# Patient Record
Sex: Male | Born: 1955
Health system: Southern US, Community
[De-identification: ages and names within clinical notes are randomized; demographics above are authoritative.]

## PROBLEM LIST (undated history)

## (undated) DIAGNOSIS — I509 Heart failure, unspecified: Secondary | ICD-10-CM

## (undated) DIAGNOSIS — I4891 Unspecified atrial fibrillation: Secondary | ICD-10-CM

## (undated) DIAGNOSIS — I130 Hypertensive heart and chronic kidney disease with heart failure and stage 1 through stage 4 chronic kidney disease, or unspecified chronic kidney disease: Secondary | ICD-10-CM

## (undated) DIAGNOSIS — I480 Paroxysmal atrial fibrillation: Secondary | ICD-10-CM

## (undated) DIAGNOSIS — I10 Essential (primary) hypertension: Secondary | ICD-10-CM

## (undated) DIAGNOSIS — I341 Nonrheumatic mitral (valve) prolapse: Secondary | ICD-10-CM

## (undated) DIAGNOSIS — I340 Nonrheumatic mitral (valve) insufficiency: Secondary | ICD-10-CM

## (undated) DIAGNOSIS — N529 Male erectile dysfunction, unspecified: Secondary | ICD-10-CM

## (undated) DIAGNOSIS — M1A9XX0 Chronic gout, unspecified, without tophus (tophi): Secondary | ICD-10-CM

## (undated) DIAGNOSIS — Z7901 Long term (current) use of anticoagulants: Secondary | ICD-10-CM

## (undated) DIAGNOSIS — I421 Obstructive hypertrophic cardiomyopathy: Secondary | ICD-10-CM

## (undated) HISTORY — DX: Heart failure, unspecified: I50.9

## (undated) HISTORY — DX: Obstructive hypertrophic cardiomyopathy: I42.1

## (undated) HISTORY — PX: CHOLECYSTECTOMY: SHX55

## (undated) HISTORY — DX: Nonrheumatic mitral (valve) insufficiency: I34.0

## (undated) HISTORY — DX: Nonrheumatic mitral (valve) prolapse: I34.1

## (undated) HISTORY — DX: Paroxysmal atrial fibrillation: I48.0

## (undated) HISTORY — DX: Long term (current) use of anticoagulants: Z79.01

## (undated) HISTORY — PX: TONSILLECTOMY: SUR1361

## (undated) HISTORY — PX: INGUINAL HERNIA REPAIR: SUR1180

## (undated) HISTORY — DX: Male erectile dysfunction, unspecified: N52.9

## (undated) HISTORY — DX: Hypertensive heart and chronic kidney disease with heart failure and stage 1 through stage 4 chronic kidney disease, or unspecified chronic kidney disease: I13.0

## (undated) HISTORY — DX: Essential (primary) hypertension: I10

## (undated) HISTORY — DX: Unspecified atrial fibrillation: I48.91

## (undated) HISTORY — DX: Chronic gout, unspecified, without tophus (tophi): M1A.9XX0

---

## 2002-09-01 HISTORY — PX: CARDIOVASCULAR STRESS TEST: SHX262

## 2002-09-08 ENCOUNTER — Encounter: Admission: RE | Admit: 2002-09-08 | Discharge: 2002-09-08 | Payer: Self-pay | Admitting: Cardiothoracic Surgery

## 2002-09-08 ENCOUNTER — Encounter: Payer: Self-pay | Admitting: Cardiothoracic Surgery

## 2002-10-07 ENCOUNTER — Ambulatory Visit (HOSPITAL_COMMUNITY): Admission: RE | Admit: 2002-10-07 | Discharge: 2002-10-07 | Payer: Self-pay | Admitting: Cardiovascular Disease

## 2007-02-23 ENCOUNTER — Emergency Department (HOSPITAL_COMMUNITY): Admission: EM | Admit: 2007-02-23 | Discharge: 2007-02-23 | Payer: Self-pay | Admitting: Emergency Medicine

## 2007-02-25 ENCOUNTER — Ambulatory Visit: Payer: Self-pay | Admitting: Internal Medicine

## 2007-02-25 ENCOUNTER — Inpatient Hospital Stay (HOSPITAL_COMMUNITY): Admission: EM | Admit: 2007-02-25 | Discharge: 2007-02-26 | Payer: Self-pay | Admitting: Emergency Medicine

## 2008-09-06 ENCOUNTER — Emergency Department (HOSPITAL_COMMUNITY): Admission: EM | Admit: 2008-09-06 | Discharge: 2008-09-06 | Payer: Self-pay | Admitting: Emergency Medicine

## 2008-09-09 ENCOUNTER — Other Ambulatory Visit: Payer: Self-pay | Admitting: General Surgery

## 2008-09-09 ENCOUNTER — Inpatient Hospital Stay (HOSPITAL_COMMUNITY): Admission: EM | Admit: 2008-09-09 | Discharge: 2008-09-13 | Payer: Self-pay | Admitting: Emergency Medicine

## 2008-09-10 ENCOUNTER — Encounter: Payer: Self-pay | Admitting: Cardiovascular Disease

## 2008-09-10 HISTORY — PX: TRANSTHORACIC ECHOCARDIOGRAM: SHX275

## 2008-09-13 ENCOUNTER — Encounter: Payer: Self-pay | Admitting: Cardiovascular Disease

## 2008-09-13 HISTORY — PX: TRANSESOPHAGEAL ECHOCARDIOGRAM: SHX273

## 2008-09-21 ENCOUNTER — Encounter (INDEPENDENT_AMBULATORY_CARE_PROVIDER_SITE_OTHER): Payer: Self-pay | Admitting: General Surgery

## 2008-09-21 ENCOUNTER — Ambulatory Visit (HOSPITAL_COMMUNITY): Admission: RE | Admit: 2008-09-21 | Discharge: 2008-09-22 | Payer: Self-pay | Admitting: General Surgery

## 2010-07-03 ENCOUNTER — Ambulatory Visit: Payer: Self-pay | Admitting: Cardiovascular Disease

## 2010-11-21 LAB — COMPREHENSIVE METABOLIC PANEL
ALT: 100 U/L — ABNORMAL HIGH (ref 0–53)
ALT: 114 U/L — ABNORMAL HIGH (ref 0–53)
ALT: 35 U/L (ref 0–53)
ALT: 74 U/L — ABNORMAL HIGH (ref 0–53)
AST: 22 U/L (ref 0–37)
AST: 48 U/L — ABNORMAL HIGH (ref 0–37)
AST: 61 U/L — ABNORMAL HIGH (ref 0–37)
Albumin: 3.3 g/dL — ABNORMAL LOW (ref 3.5–5.2)
Albumin: 3.5 g/dL (ref 3.5–5.2)
Albumin: 3.6 g/dL (ref 3.5–5.2)
Alkaline Phosphatase: 107 U/L (ref 39–117)
Alkaline Phosphatase: 113 U/L (ref 39–117)
Alkaline Phosphatase: 58 U/L (ref 39–117)
Alkaline Phosphatase: 85 U/L (ref 39–117)
BUN: 15 mg/dL (ref 6–23)
BUN: 16 mg/dL (ref 6–23)
BUN: 18 mg/dL (ref 6–23)
CO2: 24 mEq/L (ref 19–32)
CO2: 26 mEq/L (ref 19–32)
CO2: 27 mEq/L (ref 19–32)
Calcium: 9 mg/dL (ref 8.4–10.5)
Calcium: 9.1 mg/dL (ref 8.4–10.5)
Chloride: 102 mEq/L (ref 96–112)
Chloride: 103 mEq/L (ref 96–112)
Chloride: 104 mEq/L (ref 96–112)
Creatinine, Ser: 1.37 mg/dL (ref 0.4–1.5)
Creatinine, Ser: 1.51 mg/dL — ABNORMAL HIGH (ref 0.4–1.5)
GFR calc Af Amer: 58 mL/min — ABNORMAL LOW (ref >60)
GFR calc Af Amer: >60 mL/min (ref >60)
GFR calc Af Amer: >60 mL/min (ref >60)
GFR calc non Af Amer: 48 mL/min — ABNORMAL LOW (ref >60)
GFR calc non Af Amer: 54 mL/min — ABNORMAL LOW (ref >60)
Glucose, Bld: 100 mg/dL — ABNORMAL HIGH (ref 70–99)
Glucose, Bld: 111 mg/dL — ABNORMAL HIGH (ref 70–99)
Glucose, Bld: 95 mg/dL (ref 70–99)
Potassium: 3.8 mEq/L (ref 3.5–5.1)
Potassium: 3.8 mEq/L (ref 3.5–5.1)
Potassium: 4 mEq/L (ref 3.5–5.1)
Potassium: 4.2 mEq/L (ref 3.5–5.1)
Sodium: 136 mEq/L (ref 135–145)
Sodium: 138 mEq/L (ref 135–145)
Sodium: 139 mEq/L (ref 135–145)
Sodium: 140 mEq/L (ref 135–145)
Total Bilirubin: 1.4 mg/dL — ABNORMAL HIGH (ref 0.3–1.2)
Total Bilirubin: 1.6 mg/dL — ABNORMAL HIGH (ref 0.3–1.2)
Total Bilirubin: 2.3 mg/dL — ABNORMAL HIGH (ref 0.3–1.2)
Total Protein: 6.4 g/dL (ref 6.0–8.3)
Total Protein: 7 g/dL (ref 6.0–8.3)
Total Protein: 7.4 g/dL (ref 6.0–8.3)

## 2010-11-21 LAB — DIFFERENTIAL
Basophils Absolute: 0 10*3/uL (ref 0.0–0.1)
Basophils Absolute: 0 10*3/uL (ref 0.0–0.1)
Basophils Absolute: 0.1 10*3/uL (ref 0.0–0.1)
Basophils Absolute: 0.1 10*3/uL (ref 0.0–0.1)
Basophils Relative: 0 % (ref 0–1)
Basophils Relative: 0 % (ref 0–1)
Basophils Relative: 1 % (ref 0–1)
Basophils Relative: 1 % (ref 0–1)
Eosinophils Absolute: 0 10*3/uL (ref 0.0–0.7)
Eosinophils Absolute: 0.1 10*3/uL (ref 0.0–0.7)
Eosinophils Absolute: 0.1 10*3/uL (ref 0.0–0.7)
Eosinophils Absolute: 0.2 10*3/uL (ref 0.0–0.7)
Eosinophils Relative: 0 % (ref 0–5)
Eosinophils Relative: 0 % (ref 0–5)
Eosinophils Relative: 1 % (ref 0–5)
Lymphocytes Relative: 10 % — ABNORMAL LOW (ref 12–46)
Lymphocytes Relative: 12 % (ref 12–46)
Lymphocytes Relative: 14 % (ref 12–46)
Lymphs Abs: 1.3 10*3/uL (ref 0.7–4.0)
Lymphs Abs: 1.3 10*3/uL (ref 0.7–4.0)
Lymphs Abs: 1.9 10*3/uL (ref 0.7–4.0)
Monocytes Absolute: 0.3 10*3/uL (ref 0.1–1.0)
Monocytes Absolute: 0.6 10*3/uL (ref 0.1–1.0)
Monocytes Absolute: 0.9 10*3/uL (ref 0.1–1.0)
Monocytes Relative: 3 % (ref 3–12)
Monocytes Relative: 5 % (ref 3–12)
Monocytes Relative: 6 % (ref 3–12)
Monocytes Relative: 7 % (ref 3–12)
Neutro Abs: 10.4 10*3/uL — ABNORMAL HIGH (ref 1.7–7.7)
Neutro Abs: 12.8 10*3/uL — ABNORMAL HIGH (ref 1.7–7.7)
Neutro Abs: 7.7 10*3/uL (ref 1.7–7.7)
Neutro Abs: 8 10*3/uL — ABNORMAL HIGH (ref 1.7–7.7)
Neutrophils Relative %: 74 % (ref 43–77)
Neutrophils Relative %: 81 % — ABNORMAL HIGH (ref 43–77)
Neutrophils Relative %: 82 % — ABNORMAL HIGH (ref 43–77)
Neutrophils Relative %: 84 % — ABNORMAL HIGH (ref 43–77)

## 2010-11-21 LAB — CARDIAC PANEL(CRET KIN+CKTOT+MB+TROPI)
CK, MB: 1.8 ng/mL (ref 0.3–4.0)
Relative Index: INVALID (ref 0.0–2.5)
Relative Index: INVALID (ref 0.0–2.5)
Total CK: 54 U/L (ref 7–232)
Troponin I: 0.15 ng/mL — ABNORMAL HIGH (ref 0.00–0.06)
Troponin I: 0.21 ng/mL — ABNORMAL HIGH (ref 0.00–0.06)

## 2010-11-21 LAB — CBC
HCT: 43.2 % (ref 39.0–52.0)
HCT: 46.5 % (ref 39.0–52.0)
HCT: 48.9 % (ref 39.0–52.0)
Hemoglobin: 14.7 g/dL (ref 13.0–17.0)
Hemoglobin: 16.3 g/dL (ref 13.0–17.0)
Hemoglobin: 16.8 g/dL (ref 13.0–17.0)
MCHC: 34.3 g/dL (ref 30.0–36.0)
MCHC: 35 g/dL (ref 30.0–36.0)
MCV: 88.5 fL (ref 78.0–100.0)
MCV: 89.9 fL (ref 78.0–100.0)
Platelets: 165 10*3/uL (ref 150–400)
Platelets: 222 10*3/uL (ref 150–400)
Platelets: 250 10*3/uL (ref 150–400)
RBC: 4.8 MIL/uL (ref 4.22–5.81)
RBC: 5.26 MIL/uL (ref 4.22–5.81)
RBC: 5.44 MIL/uL (ref 4.22–5.81)
RDW: 15.1 % (ref 11.5–15.5)
RDW: 15.1 % (ref 11.5–15.5)
RDW: 15.2 % (ref 11.5–15.5)
WBC: 10.4 10*3/uL (ref 4.0–10.5)
WBC: 12.3 10*3/uL — ABNORMAL HIGH (ref 4.0–10.5)
WBC: 15.7 10*3/uL — ABNORMAL HIGH (ref 4.0–10.5)

## 2010-11-21 LAB — POCT CARDIAC MARKERS
CKMB, poc: 1.7 ng/mL (ref 1.0–8.0)
Myoglobin, poc: 158 ng/mL (ref 12–200)
Myoglobin, poc: 257 ng/mL (ref 12–200)
Troponin i, poc: <0.05 ng/mL (ref 0.00–0.09)

## 2010-11-21 LAB — TSH: TSH: 2.137 u[IU]/mL (ref 0.350–4.500)

## 2010-11-21 LAB — URINALYSIS, ROUTINE W REFLEX MICROSCOPIC
Glucose, UA: NEGATIVE mg/dL
Hgb urine dipstick: NEGATIVE
Protein, ur: NEGATIVE mg/dL

## 2010-12-19 NOTE — Discharge Summary (Signed)
[name], VINES NO.:  [id number]   MEDICAL RECORD NO.:  [id number]          PATIENT TYPE:  INP   LOCATION:  4707                         FACILITY:  [facility]   PHYSICIAN:  Vesta Mixer, M.D. DATE OF BIRTH:  [birth year]   DATE OF ADMISSION:  09/09/2008  DATE OF DISCHARGE:  09/13/2008                               DISCHARGE SUMMARY   DISCHARGE DIAGNOSES:  1. Atrial fibrillation with rapid ventricular response.  2. Hypertrophic cardiomyopathy with known left ventricular outflow      tract gradient.  3. Mitral valve prolapse with systolic anterior motion of the mitral      valve leaflet.  4. Hypertension.  5. Mild obesity.   DISCHARGE MEDICATIONS:  1. Lovenox 120 mg subcutaneously twice a day.  2. Lisinopril 10 mg a day.  3. Metoprolol 50 mg twice a day.  4. Aspirin 81 mg a day.   DISPOSITION:  The patient has been instructed to take his Lovenox every  12 hours.  He will get in contact with Dr. Doreen Salvage office this week  for a possible surgery next week.  He needs to have a cholecystectomy.  He will continue the Lovenox for at least a week and possibly 2 weeks.  We will see him in followup in a week or two.   HISTORY:  [name] is a 55 year old gentleman who was admitted to the  hospital with new onset atrial fibrillation.  He had previously been  evaluated by Dr. Dwain Sarna for cholecystitis.  Please see dictated H and  P for further details.   HOSPITAL COURSE:  1. Atrial fibrillation.  The patient presented with atrial      fibrillation and was started on a Cardizem drip.  His rate slowed,      but he field to convert.  His rate was fairly well controlled.  On      September 13, 2008, he underwent a TEE.  The TEE revealed no thrombus      in the left atrial appendage or left atrium.  He had normal left      ventricular systolic function.  He subsequently underwent      cardioversion.  He had been continued on the Lovenox and that at      least need  to take for a week or two.  Overall, he has remained      stable and he tolerated the procedure quite well.  2. Cholecystitis.  The patient has cholecystitis with evidence of      gallstones.  He needs to have a cholecystectomy.  We anticipate      doing surgery next week.  If for some reason the surgery is      delayed, it will not be for another month or so.  We will stop the      Lovenox and start him on Coumadin.  He will anticipate several      weeks of      anticoagulation following this cardioversion.  It would not be      possible to start him on Coumadin and then  bring his protime down      in time to have a cholecystectomy next week.  Therefore, we have      opted to go with subcutaneous Lovenox for the next 7-10 days.      Also, other medical problems are stable.      Vesta Mixer, M.D.  Electronically Signed     PJN/MEDQ  D:  09/13/2008  T:  09/14/2008  Job:  [id number]   cc:   Juanetta Gosling, MD
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
## 2010-12-19 NOTE — Consult Note (Signed)
[name], [name]:  [id number]   MEDICAL RECORD NO.:  [id number]          PATIENT TYPE:  AMB   LOCATION:  SDS                          FACILITY:  [facility]   PHYSICIAN:  [name], MDDATE OF BIRTH:  Mar 11, 1956   DATE OF CONSULTATION:  DATE OF DISCHARGE:                                 CONSULTATION   [name] is a 55 year old male who I saw him in the office yesterday  for cholelithiasis.  He had an episode on Sunday and Monday of some  abdominal pain, epigastric or right upper quadrant associated with  nausea and vomiting, seen in emergency room at that point  with  diagnosis of cholelithiasis with no evidence of cholecystitis at that  point.  Since I saw him on Wednesday, he has continued to have some  abdominal pain and sweats.  He has not been hungry at first during that  time and was burping a lot.  Denies any fevers.  He has not just feeling  well and he was feeling fairly lethargic since then.  I examined him in  the office and do not think he had cholecystitis, but scheduled him  today for a laparoscopic cholecystectomy due to his continued symptoms.  On evaluation today, preoperatively he felt little worse than he did  yesterday and he was noted to have rapid atrial fibrillation with heart  rate in the 130s.  I then had him cancel his operation and had him  report to the emergency room where he has been evaluated by [facility]  Cardiology Associate, [name].  He is a patient of [name] and he  is awaiting admission right now.  On his abdominal exam, he remains only  mildly tender in his right upper quadrant with deep palpation.  Has no  Murphy sign.  His white blood cell count is 12.3 today and the bilirubin  is mildly elevated at 1.6 this afternoon as well.  I do not think again  he has acute cholecystitis.  We will continue to follow along with Dr.  Elease Adams, the cardiologist and we can both determine whether it would be  safe to  perform a cholecystectomy on him, but this will likely be  pending further cardiac evaluation unless he develops an increasing  bilirubin or other signs that he will need an operation sooner than  that.  Thank you very much for assisting with his care.      Johnny Gosling, MD  Electronically Signed     MCW/MEDQ  D:  09/09/2008  T:  09/10/2008  Job:  [id number]   cc:   Elmore Guise., M.D.  Vesta Mixer, M.D.

## 2010-12-19 NOTE — H&P (Signed)
[name], [name] NO.:  [id number]   MEDICAL RECORD NO.:  [id number]          PATIENT TYPE:  INP   LOCATION:  1832                         FACILITY:  [facility]   PHYSICIAN:  Elmore Guise., M.D.DATE OF BIRTH:  1956/06/03   DATE OF ADMISSION:  09/09/2008  DATE OF DISCHARGE:                              HISTORY & PHYSICAL   INDICATION FOR ADMISSION:  New onset atrial fibrillation.   PRIMARY CARDIOLOGIST:  Vesta Mixer, M.D.   HISTORY OF PRESENT ILLNESS:  [name] is a 55 year old white male past  medical history of obesity, hypertrophic cardiomyopathy, hypertension  who presents for evaluation and treatment of new-onset atrial  fibrillation.  Patient reports over the last week a downhill course.  Sunday night he had tremendous abdominal pain and cramping, this was  also associated with nausea and increasing gas.  He was brought to the  emergency room and a CT of the abdomen and pelvis were performed which  showed gallstones, he then had an ultrasound which again showed  gallstones with borderline gallbladder thickening.  He had no evidence  of acute cholecystitis and was discharged home.  He went and followed up  with a general surgeon and was scheduled for elective cholecystectomy  today.  However, on a preop electrocardiogram he was noted to be in  rapid atrial fibrillation with heart rate in the 130s.  The patient  notes since Sunday he has been having increasing fatigue, just feeling  tired and listless.  He denies any chest pain or shortness of breath.  His abdominal pain has resolved.  He has also noted decreased p.o.  intake since Sunday because I just have no appetite.  He was sent to  the emergency room for further evaluation and treatment.  Here, he was  started on Cardizem drip at 5 mg per hour and his heart rate is in the  90-120 range.  He is awaiting admission.   REVIEW OF SYSTEMS:  Are as per HPI.  All others are negative.   CURRENT  MEDICATIONS:  1. Lisinopril 10 mg daily.  2. Metoprolol 50 mg twice daily.  3. Aspirin 81 mg daily.   ALLERGIES:  CODEINE.   FAMILY HISTORY:  Positive for COPD with his mother, father was an  alcoholic.   SOCIAL HISTORY:  He is married.  No tobacco or alcohol use.   EXAM:  He is afebrile.  Blood pressure is 120/70, heart rates ranging  from 95-120, showing atrial fibrillation, saturating 95% on room air.  GENERAL:  He is a very pleasant middle-aged white male, alert and  oriented x4, in no acute distress.  NECK:  Supple.  No lymphadenopathy, 2+ carotids, no JVD and no bruits.  LUNGS:  Clear.  HEART:  Irregular regular with 2/6 systolic ejection murmur.  ABDOMEN:  Obese, soft, nontender, nondistended.  EXTREMITIES:  Warm with 2+ pulses and no significant edema.  NEUROLOGICALLY:  There are no focal deficits noted.  SKIN:  Warm and dry without evidence of rashes or ecchymosis.   His blood work was reviewed and showed potassium of  3.8, BUN and  creatinine of 18 and 1.3, his total bilirubin was 1.6, alkaline  phosphatase of 107, AST of 48 and ALT of 100.  His magnesium was 2.4.  Cardiac markers were negative with troponin less than 0.05 and MB of  2.6.  CBC showed a white count of 12.3, hemoglobin of 16.3 and platelet  count of 222.  He had 84% neutrophils, 10% lymphs, no bands.  Total  bilirubin actually was down from a CMP done earlier, down from 2.3 now  to 1.6, lipase earlier in the week was negative, renal function appear  prerenal at current time.  His echocardiogram shows rapid atrial  fibrillation, rate of 138 beats per minute with LVH and strain pattern  unchanged from his prior electrocardiograms in the office.  Electrocardiogram #2 shows atrial fibrillation with rapid ventricular  response, heart rate of 122 per minute with LVH and strain pattern.  His  last electrocardiogram in the office was done December 2009, showed  normal sinus rhythm.  He had his last echocardiogram  done June 2006,  showed an EF of 75% with moderate to severe LVH, septum measured 21-mm,  posterior wall 19-mm, he had mild aortic insufficiency, trace mitral and  tricuspid valve regurgitation.  He has also had a cardiac  catheterization in the past which showed normal coronary arteries, this  was done back in 2004.   IMPRESSION:  1. New-onset atrial fibrillation.  2. History of hypertrophic cardiomyopathy.  3. Obesity.  4. Cholelithiasis.  5. Mild dehydration.   PLAN:  The patient will be admitted.  We will increase his Cardizem  drip, as needed, for rate control.  Hopefully, he will convert to normal  sinus rhythm after rate is controlled.  We will hold his lisinopril for  now.  I do think his mildly increased BUN and creatinine for him is  secondary to dehydration.  He will have a repeat TSH done as well as an  echocardiogram done, since his last study was over 4 years ago.  We will  continue his metoprolol for now.  I do think holding on his surgery  would be best at this time.  This was discussed with the patient and his  wife at length.      Elmore Guise., M.D.  Electronically Signed     TWK/MEDQ  D:  09/09/2008  T:  09/09/2008  Job:  [id number]

## 2010-12-19 NOTE — Op Note (Signed)
[name], PLOCH NO.:  [id number]   MEDICAL RECORD NO.:  [id number]          PATIENT TYPE:  OIB   LOCATION:  1614                         FACILITY:  [facility]   PHYSICIAN:  Juanetta Gosling, MDDATE OF BIRTH:  1956/01/14   DATE OF PROCEDURE:  09/21/2008  DATE OF DISCHARGE:                               OPERATIVE REPORT   PREOPERATIVE DIAGNOSIS:  Chronic cholecystitis.   POSTOPERATIVE DIAGNOSIS:  Chronic cholecystitis.   PROCEDURE:  Laparoscopic cholecystectomy with cholangiogram.   SURGEON:  Harden Mo, M.D.   ASSISTANT:  Darnell Level, M.D.   ANESTHESIA:  General.   FINDINGS:  Acute on chronic cholecystitis.   SPECIMENS:  Gallbladder and contents to pathology.   FINDINGS:  Cholangiogram was normal.   ESTIMATED BLOOD LOSS:  50 mL.   COMPLICATIONS:  None.   DRAINS:  None.   DISPOSITION:  To recovery room in stable condition.   INDICATIONS:  Mr. Schleicher is a 55 year old male who had a couple of  episodes in the last month of right upper quadrant pain, nausea and  vomiting.  He was seen in the emergency room, was found to have a normal  white blood cell count, and was afebrile.  Underwent a CT scan that  showed gallstones and an ultrasound later that showed multiple  subcentimeter stones and sludge with borderline gallbladder wall  thickening and negative sonographic Murphy's sign.  He was sent home on  pain medication.  He saw me on February 3 still feeling very poorly, and  I scheduled him to remove his gallbladder the following day.  The  following day, he remained for his EKG preoperatively and was found to  be in atrial fibrillation with rapid ventricular response.  He was  admitted by Dr. Elease Hashimoto and was recontrolled and eventually cardioverted  and placed on anticoagulation.  In discussion with Dr. Elease Hashimoto, I elected  to take his gallbladder out this week, and he came in today still having  some right upper quadrant pain occasionally for his  cholecystectomy.   PROCEDURE:  After informed consent was obtained, the patient was taken  to the operating room.  He was administered 1 gram of intravenous  cefoxitin.  He then underwent general endotracheal anesthesia without  complication.  His abdomen was then prepped and draped in standard  sterile surgical fashion with chlorhexidine.  Surgical time out was then  performed.   A 10-mm infraumbilical curvilinear incision was then made below the  level of his umbilical hernia that was present preoperatively.  Dissection was carried out down to the level of fascia.  This was then  entered sharply.  A 0 Vicryl pursestring suture was then placed through  the fascia.  The peritoneum was then entered bluntly.  I then introduced  a Hassan trocar and insufflated the abdomen to 15 mmHg pressure.  He was  then placed in the head-up/left-side down position.  Three further 5-mm  trocars were placed under direct vision after infiltration with local  anesthetic without complication.  He was noted to have some omental  adhesions to his gallbladder that were  brought down bluntly.  His  gallbladder was then retracted cephalad and lateral.  He had a  significant amount of inflammation surrounding his gallbladder and was  very difficult to dissect his triangle of Calot.  Dissection was  eventually able to identify a very short cystic duct at the common bile  duct junction as well as the cystic artery.  The cystic artery was  encircled and then clipped and divided.  The cystic duct was then  identified.  A clip was placed distally.  A Cook catheter was then  introduced into the abdomen.  A ductotomy was then made, and the [facility]  catheter was introduced into the cystic duct.  Another clip was used to  secure this in position.  This flowed freely without any leakage.  Cholangiogram was then performed.  I put him in the head down position  to get this to fill proximally, but in the end, both hepatic  radicals  filled,Cook catheter was positioned in the cystic duct and the duodenum  filled without any evidence of any stones.  The clip and catheter were  then removed.  The cystic duct was then clipped twice and divided.  The  gallbladder was then removed from the liver bed with some difficulty.  I  made several entrances into the liver, and bleeding was controlled with  cautery.  There was a very thick rind around his gallbladder.  There  were no spillage of bile or stones during this portion.  Eventually, the  gallbladder was removed from the liver bed.  This was placed in an  EndoCatch bag and removed from the umbilicus.  Due to the amount of  stones, the incision had to be enlarged significantly to remove this.  The bag did rip at this portion, and the gallbladder did come in contact  with the wound for a very short period of time.  Gallbladder eventually  was removed and passed off the table as a specimen.  I then used 3  figure-of-eight 0 Vicryl sutures to repair his umbilical hernia as well  as the defect from my incision.  Upon inspection, there was no further  defect at the end.  Hemostasis was then observed.  Copious irrigation  was performed.  The abdomen was then desufflated, and the trocars were  all removed.  The incisions were all closed with a 4-0 Monocryl in a  subcuticular fashion.  Steri-Strips and sterile dressing was placed all  over these.  He tolerated this well, was extubated in the operating room  and transferred to the recovery room in stable condition.      Juanetta Gosling, MD  Electronically Signed     MCW/MEDQ  D:  09/21/2008  T:  09/21/2008  Job:  [id number]   cc:   Vesta Mixer, M.D.  Fax: [phone]

## 2010-12-22 NOTE — H&P (Signed)
NAME:  [name], [name]                       ACCOUNT NO.:  [id number]   MEDICAL RECORD NO.:  [id number]                   PATIENT TYPE:  OIB   LOCATION:                                       FACILITY:  [facility]   PHYSICIAN:  Vesta Mixer, M.D.              DATE OF BIRTH:  1956/05/24   DATE OF ADMISSION:  10/07/2002  DATE OF DISCHARGE:                                HISTORY & PHYSICAL   HISTORY OF PRESENT ILLNESS:  [name] is a 55 year old gentleman with a  history of chest pain and congestive heart failure.  He has had a long  history of hypertension.  He has been recently seen in our office and has a  dilated left ventricle with somewhat of a reduced left ventricular systolic  function.  He was also found to have a lung mass on chest x-ray, but  subsequent evaluations of this mass have revealed that it appears to be  benign.  He has been followed by Dr. Charlett Lango for this lung mass.  [name] has not been to the doctor on a regular basis.  He states that he  really only goes when he has any specific problems.  He thinks that he has  had some hypertension for the past 5 or 6 years.  He is admitted to the  hospital for heart catheterization for further evaluation of his  cardiomyopathy.   CURRENT MEDICATIONS:  1. Hydrochlorothiazide 25 mg daily.  2. Altace 10 mg p.o. b.i.d.  3. Coreg 3.125 mg twice a day.   ALLERGIES:  CODEINE.   PAST MEDICAL HISTORY:  Hypertension.   SOCIAL HISTORY:  The patient works as a Counsellor at [employer].  He does not smoke and does not drink alcohol.   FAMILY HISTORY:  His father died of alcoholism.  His mother died due to  complications related to COPD.   REVIEW OF SYSTEMS:  He has had lots of fatigue and shortness of breath  recently.  He states that he is feeling a little bit better since we started  him on some medications.   PHYSICAL EXAMINATION:  GENERAL:  He is a middle-aged gentleman in no acute  distress.   He is alert and oriented x3, and his mood and affect are normal.  VITAL SIGNS:  Weight is 249, blood pressure 122/78, heart rate 84.  HEENT:  Reveals 2+ carotids.  He has no bruits.  There is no JVD, no  thyromegaly.  LUNGS:  Clear to auscultation.  HEART:  Regular rate.  S1 and S2 with no murmurs, gallops or rubs.  ABDOMEN:  Good bowel sounds, but is nontender.  EXTREMITIES:  He has no clubbing, cyanosis, or edema.  His pulses are  intact.   [name] presents with new onset congestive heart failure.  The most likely  cause of this is long-standing hypertension.  He really has not been to  see  the doctor in quite a long time.  I do think that we need to perform a heart  catheterization to rule out coronary artery disease.  Coronary artery  disease would also be a possible etiology for his congestive heart failure.  We have discussed the risks, benefits, and options concerning heart  catheterization.  He understands and agrees to proceed.                                               Vesta Mixer, M.D.    PJN/MEDQ  D:  09/23/2002  T:  09/23/2002  Job:  [id number]   cc:   Windle Guard, M.D.  [address]  [place], [place] [address]  Fax: [phone]   Salvatore Decent. Dorris Fetch, M.D.  [address]  [place]  [place] [address]  Fax: [phone]

## 2010-12-22 NOTE — Cardiovascular Report (Signed)
   NAME:  TOMMASO, CAVITT NO.:  [id number]   MEDICAL RECORD NO.:  [id number]                   PATIENT TYPE:  OIB   LOCATION:  2899                                 FACILITY:  [facility]   PHYSICIAN:  Vesta Mixer, M.D.              DATE OF BIRTH:  07/07/1956   DATE OF PROCEDURE:  10/07/2002  DATE OF DISCHARGE:                              CARDIAC CATHETERIZATION   PROCEDURE:  Left heart catheterization with coronary arteriography.   DESCRIPTION OF PROCEDURE:  The right femoral artery was easily cannulated  using a modified Seldinger technique.   HEMODYNAMICS:  The LV pressure was 123/13 with an aortic pressure of 133/76.   ANGIOGRAPHY:  1. The left main coronary artery is smooth and normal.  2. The left anterior descending artery is smooth and normal.  The other     diagonal vessels are normal.  3. The left circumflex artery is relatively large and normal.  The first     obtuse marginal arteries are normal.  4. The right coronary artery is moderate sized and is dominant.  The     posterior descending artery and the posterolateral segment artery are     normal.   LEFT VENTRICULOGRAM:  The left ventriculogram was performed in a 30-degree  RAO position.  It reveals overall normal left ventricular systolic function.  The left ventricle is mildly dilated.   COMPLICATIONS:  None.   CONCLUSIONS:  1. Smooth and normal coronary arteries.  2. Normal left ventricular systolic function with a mildly dilated left     ventricle.                                                    Vesta Mixer, M.D.    PJN/MEDQ  D:  10/07/2002  T:  10/07/2002  Job:  [id number]

## 2011-01-09 ENCOUNTER — Ambulatory Visit: Payer: Self-pay | Admitting: Cardiovascular Disease

## 2011-02-19 ENCOUNTER — Encounter: Payer: Self-pay | Admitting: Cardiovascular Disease

## 2011-02-26 ENCOUNTER — Encounter: Payer: Self-pay | Admitting: Cardiovascular Disease

## 2011-02-26 ENCOUNTER — Ambulatory Visit (INDEPENDENT_AMBULATORY_CARE_PROVIDER_SITE_OTHER): Payer: BC Managed Care – PPO | Admitting: Cardiovascular Disease

## 2011-02-26 VITALS — BP 148/110 | HR 64 | Ht 72.0 in | Wt 258.4 lb

## 2011-02-26 DIAGNOSIS — I482 Chronic atrial fibrillation, unspecified: Secondary | ICD-10-CM

## 2011-02-26 DIAGNOSIS — I4891 Unspecified atrial fibrillation: Secondary | ICD-10-CM

## 2011-02-26 DIAGNOSIS — I421 Obstructive hypertrophic cardiomyopathy: Secondary | ICD-10-CM | POA: Insufficient documentation

## 2011-02-26 DIAGNOSIS — I10 Essential (primary) hypertension: Secondary | ICD-10-CM | POA: Insufficient documentation

## 2011-02-26 HISTORY — DX: Obstructive hypertrophic cardiomyopathy: I42.1

## 2011-02-26 HISTORY — DX: Chronic atrial fibrillation, unspecified: I48.20

## 2011-02-26 HISTORY — DX: Unspecified atrial fibrillation: I48.91

## 2011-02-26 MED ORDER — HYDROCHLOROTHIAZIDE 25 MG PO TABS: 25.0000 mg | ORAL_TABLET | Freq: Every day | ORAL | Status: DC

## 2011-02-26 MED ORDER — POTASSIUM CHLORIDE ER 10 MEQ PO TBCR: 10.0000 meq | EXTENDED_RELEASE_TABLET | ORAL | Status: DC

## 2011-02-26 NOTE — Assessment & Plan Note (Signed)
His blood pressures mildly to moderately elevated today. We'll add HCTZ 25 mg a day. We'll also have potassium chloride 10 mg a day. I'll see him again in 3 months for an office visit and basic metabolic profile.

## 2011-02-26 NOTE — Assessment & Plan Note (Signed)
He is doing very well from a cardiac standpoint. He denies any episodes of chest pain. His systolic murmur is very quiet and I do not think that he has a significant outflow gradient at this time.

## 2011-02-26 NOTE — Progress Notes (Signed)
[name] Date of Birth  12-26-1955 [facility] [address]     [address] [place], [place]  [address] [phone]  Fax  [phone]  History of Present Illness:  [name] is a 55 year old gentleman with a history of hypertrophic obstructive cardiomyopathy. He has been very well controlled on blockade. He has atrial fibrillation. We've cardioverted him in the past but he converted back into fibrillation fairly quickly. He tolerates his atrial fibrillation quite well. He has not had any episodes of chest pain, shortness of breath, syncope, or presyncope.  He also has a history of hypertension. He blames this on his excessive weight. He's been trying to lose weight and has recently gone on a strict diet and exercise program. He also has a history of mitral prolapse with mild mitral regurgitation.  His only complaint is that he's been having some diarrhea. He thinks that he could actually be contributing to the diarrhea. He also experienced some diarrhea when he took his Xarelto.  He has history of cholecystectomy in the past.  Current Outpatient Prescriptions  Medication Sig Dispense Refill  . amLODipine-valsartan (EXFORGE) 5-320 MG per tablet Take 1 tablet by mouth daily. Take 3/4       . aspirin 81 MG tablet Take 81 mg by mouth daily.        . dabigatran (PRADAXA) 150 MG CAPS Take 150 mg by mouth every 12 (twelve) hours.        . metoprolol (LOPRESSOR) 100 MG tablet Take 100 mg by mouth 2 (two) times daily.           Allergies  Allergen Reactions  . Codeine     Past Medical History  Diagnosis Date  . PAF (paroxysmal atrial fibrillation)   . Hypertension   . ED (erectile dysfunction)   . MVP (mitral valve prolapse)   . Mitral regurgitation   . Hypertrophic obstructive cardiomyopathy   . CHF (congestive heart failure)   . Chest pain     Past Surgical History  Procedure Date  . Cholecystectomy   . Transesophageal echocardiogram  09/13/2008  . Transthoracic echocardiogram 09/10/2008    EF 70-75%  . Cardiovascular stress test 09/01/2002    EF 35%    History  Smoking status  . Never Smoker   Smokeless tobacco  . Not on file    History  Alcohol Use No    Family History  Problem Relation Age of Onset  . COPD Mother   . Alcohol abuse Father     Reviw of Systems:  Reviewed in the HPI.  All other systems are negative.  Physical Exam: BP 148/110  Pulse 64  Ht 6' (1.829 m)  Wt 258 lb 6.4 oz (117.209 kg)  BMI 35.05 kg/m2 The patient is alert and oriented x 3.  The mood and affect are normal.   Skin: warm and dry.  Color is normal.    HEENT:   the sclera are nonicteric.  The mucous membranes are moist.  The carotids are 2+ without bruits.  There is no thyromegaly.  There is no JVD.    Lungs: clear.  The chest wall is non tender.    Heart: Irregular rate with a normal S1 and S2.  He has a soft systolic murmur. The PMI is not displaced.     Abdomen: good bowel sounds.  There is no guarding or rebound.  There is no hepatosplenomegaly or tenderness.  There are no masses.  Extremities:  no clubbing, cyanosis, or edema.  The legs are without rashes.  The distal pulses are intact.   Neuro:  Cranial nerves II - XII are intact.  Motor and sensory functions are intact.    The gait is normal.  ECG: Atrial fibrillation with a slow ventricular response. He has occasional intraventricular conduction beats or aberrantly conducted beats. He has left ventricle hypertrophy with QRS widening.  Assessment / Plan:

## 2011-04-10 ENCOUNTER — Other Ambulatory Visit: Payer: Self-pay | Admitting: Cardiovascular Disease

## 2011-04-10 DIAGNOSIS — I10 Essential (primary) hypertension: Secondary | ICD-10-CM

## 2011-04-10 DIAGNOSIS — I4891 Unspecified atrial fibrillation: Secondary | ICD-10-CM

## 2011-05-21 LAB — DIFFERENTIAL
Basophils Absolute: 0
Basophils Absolute: 0.1
Basophils Relative: 0
Basophils Relative: 1
Eosinophils Relative: 3
Lymphocytes Relative: 12
Lymphocytes Relative: 18
Lymphs Abs: 1.2
Lymphs Abs: 1.7
Monocytes Absolute: 0.5
Monocytes Absolute: 0.6
Monocytes Relative: 6
Neutro Abs: 7.8 — ABNORMAL HIGH
Neutro Abs: 8.2 — ABNORMAL HIGH
Neutrophils Relative %: 76
Neutrophils Relative %: 82 — ABNORMAL HIGH

## 2011-05-21 LAB — CBC
HCT: 39.1
HCT: 41.2
Hemoglobin: 12.6 — ABNORMAL LOW
Hemoglobin: 13.6
Hemoglobin: 13.9
MCHC: 33.8
MCHC: 35
MCV: 86.6
Platelets: 159
Platelets: 163
Platelets: 207
RBC: 4.52
RDW: 14.6 — ABNORMAL HIGH
RDW: 14.7 — ABNORMAL HIGH
WBC: 9.8

## 2011-05-21 LAB — I-STAT 8, (EC8 V) (CONVERTED LAB)
Acid-Base Excess: 1
BUN: 15
Bicarbonate: 25.6 — ABNORMAL HIGH
Chloride: 106
HCT: 40
Hemoglobin: 13.6
Operator id: 291361
Sodium: 140
pCO2, Ven: 39.3 — ABNORMAL LOW

## 2011-05-21 LAB — PROTIME-INR
INR: 1
Prothrombin Time: 13.3

## 2011-05-21 LAB — COMPREHENSIVE METABOLIC PANEL
Albumin: 3.8
BUN: 12
Calcium: 9.7
Creatinine, Ser: 0.98
Glucose, Bld: 100 — ABNORMAL HIGH
Potassium: 3.8
Total Protein: 7.1

## 2011-05-21 LAB — POCT I-STAT CREATININE
Creatinine, Ser: 1.3
Operator id: 291361

## 2011-05-21 LAB — BASIC METABOLIC PANEL
BUN: 12
CO2: 27
Calcium: 9
Glucose, Bld: 91
Sodium: 140

## 2011-05-21 LAB — WOUND CULTURE: Culture: NO GROWTH

## 2011-05-28 ENCOUNTER — Other Ambulatory Visit: Payer: BC Managed Care – PPO | Admitting: *Deleted

## 2011-05-28 ENCOUNTER — Ambulatory Visit: Payer: BC Managed Care – PPO | Admitting: Cardiovascular Disease

## 2011-06-18 ENCOUNTER — Other Ambulatory Visit: Payer: BC Managed Care – PPO | Admitting: *Deleted

## 2011-06-18 ENCOUNTER — Ambulatory Visit: Payer: BC Managed Care – PPO | Admitting: Cardiovascular Disease

## 2011-07-04 ENCOUNTER — Telehealth: Payer: Self-pay | Admitting: Cardiovascular Disease

## 2011-07-04 NOTE — Telephone Encounter (Signed)
All Cardiac faxed to [facility] @ [phone]  07/04/11/km

## 2012-07-10 HISTORY — PX: COLONOSCOPY: SHX174

## 2012-10-01 ENCOUNTER — Telehealth: Payer: Self-pay | Admitting: Cardiovascular Disease

## 2012-10-01 NOTE — Telephone Encounter (Signed)
All Cardiac faxed to [facility] @ [phone]    Call Back [phone] 10/01/12/KM

## 2012-12-02 ENCOUNTER — Encounter: Payer: Self-pay | Admitting: Cardiovascular Disease

## 2015-02-17 DIAGNOSIS — I130 Hypertensive heart and chronic kidney disease with heart failure and stage 1 through stage 4 chronic kidney disease, or unspecified chronic kidney disease: Secondary | ICD-10-CM | POA: Insufficient documentation

## 2015-02-17 DIAGNOSIS — Z7901 Long term (current) use of anticoagulants: Secondary | ICD-10-CM | POA: Insufficient documentation

## 2015-02-17 HISTORY — DX: Long term (current) use of anticoagulants: Z79.01

## 2015-02-17 HISTORY — DX: Hypertensive heart and chronic kidney disease with heart failure and stage 1 through stage 4 chronic kidney disease, or unspecified chronic kidney disease: I13.0

## 2015-02-21 DIAGNOSIS — M1A9XX0 Chronic gout, unspecified, without tophus (tophi): Secondary | ICD-10-CM | POA: Insufficient documentation

## 2015-02-21 HISTORY — DX: Chronic gout, unspecified, without tophus (tophi): M1A.9XX0

## 2017-02-13 ENCOUNTER — Telehealth: Payer: Self-pay | Admitting: Cardiology

## 2017-02-13 ENCOUNTER — Other Ambulatory Visit: Payer: Self-pay | Admitting: Cardiology

## 2017-02-13 DIAGNOSIS — I10 Essential (primary) hypertension: Secondary | ICD-10-CM

## 2017-02-13 MED ORDER — AMLODIPINE BESYLATE 5 MG PO TABS: 5.0000 mg | ORAL_TABLET | Freq: Every day | ORAL | 3 refills | Status: DC

## 2017-02-13 NOTE — Telephone Encounter (Signed)
Call amlodipine to 5 mg to [facility]

## 2017-02-13 NOTE — Telephone Encounter (Signed)
I sent script and asked AT to have have him schedule office

## 2017-02-13 NOTE — Telephone Encounter (Signed)
He was scheduled for 03/14/17

## 2017-03-14 ENCOUNTER — Ambulatory Visit (INDEPENDENT_AMBULATORY_CARE_PROVIDER_SITE_OTHER): Payer: BC Managed Care – PPO | Admitting: Cardiology

## 2017-03-14 ENCOUNTER — Encounter: Payer: Self-pay | Admitting: Cardiology

## 2017-03-14 VITALS — BP 126/82 | HR 67 | Ht 72.0 in | Wt 281.4 lb

## 2017-03-14 DIAGNOSIS — I421 Obstructive hypertrophic cardiomyopathy: Secondary | ICD-10-CM

## 2017-03-14 DIAGNOSIS — I482 Chronic atrial fibrillation, unspecified: Secondary | ICD-10-CM

## 2017-03-14 DIAGNOSIS — I130 Hypertensive heart and chronic kidney disease with heart failure and stage 1 through stage 4 chronic kidney disease, or unspecified chronic kidney disease: Secondary | ICD-10-CM

## 2017-03-14 NOTE — Patient Instructions (Signed)

## 2017-03-14 NOTE — Progress Notes (Signed)
Cardiology Office Note:    Date:  03/14/2017   ID:  [name], DOB Mar 13, 1956, MRN [id number]  PCP:  Melony Overly, MD  Cardiologist:  Shirlee More, MD    Referring MD: Leonard Downing, *    ASSESSMENT:    1. Hypertrophic obstructive cardiomyopathy ([place])   2. Chronic a-fib ([place])   3. Hypertensive heart and chronic kidney disease with heart failure and stage 1 through stage 4 chronic kidney disease, or chronic kidney disease ([place])    PLAN:    In order of problems listed above:  1. Stable asymptomatic and with no murmur on physical examination I would continue his current medical regimen including beta blocker calcium channel blocker and avoid diuretics and vasodilators second precipitate left ventricular outflow tract obstruction. I requested a copy of his previous echocardiogram and stress test at this time I don't think he requires repeat cardiac imaging. 2. Stable rate controlled continue beta blocker and the patient's choice for low-dose aspirin 3. Stable blood pressure target continue current regimen including calcium channel blocker beta blocker and ARB.   Next appointment: 6 months   Medication Adjustments/Labs and Tests Ordered: Current medicines are reviewed at length with the patient today.  Concerns regarding medicines are outlined above.  Orders Placed This Encounter  Procedures  . EKG 12-Lead   No orders of the defined types were placed in this encounter.   Chief Complaint  Patient presents with  . Follow-up    1 year follow up for hypertrophic cardiomyopathy    History of Present Illness:    [name] is a 61 y.o. male with a hx of obstructive hypertrophic cardiomyopathy, heart failure, chronic atrial fibrillationhypertension and CKD last seen 1 year ago.Cardiac perspective he is done well he's had no exercise intolerance and dyspnea chest pain palpitation or syncope. Unfortunately he has peripheral edema related to his calcium channel  blocker.Marland Kitchen He stopped his anticoagulant and is back on low-dose aspirin Compliance with diet, lifestyle and medications: Yes Past Medical History:  Diagnosis Date  . Atrial fibrillation ([place]) 02/26/2011  . Chest pain   . CHF (congestive heart failure) ([place])   . Chronic anticoagulation 02/17/2015  . Chronic gout 02/21/2015  . ED (erectile dysfunction)   . Hypertension   . Hypertensive heart and chronic kidney disease with heart failure and stage 1 through stage 4 chronic kidney disease, or chronic kidney disease ([place]) 02/17/2015  . Hypertr obst cardiomyop   . Hypertrophic obstructive cardiomyopathy ([place]) 02/26/2011   Overview:  Last Assessment & Plan:  He is doing very well from a cardiac standpoint. He denies any episodes of chest pain. His systolic murmur is very quiet and I do not think that he has a significant outflow gradient at this time.  . Mitral regurgitation   . MVP (mitral valve prolapse)   . PAF (paroxysmal atrial fibrillation) ([place])     Past Surgical History:  Procedure Laterality Date  . CARDIOVASCULAR STRESS TEST  09/01/2002   EF 35%  . CHOLECYSTECTOMY    . TONSILLECTOMY    . TRANSESOPHAGEAL ECHOCARDIOGRAM  09/13/2008  . TRANSTHORACIC ECHOCARDIOGRAM  09/10/2008   EF 70-75%    Current Medications: Current Meds  Medication Sig  . amLODipine (NORVASC) 5 MG tablet Take 1 tablet (5 mg total) by mouth daily.  Marland Kitchen aspirin 81 MG tablet Take 81 mg by mouth daily.    . calcium carbonate (CALCIUM 600) 600 MG TABS tablet Take 600 mg by mouth daily.  Marland Kitchen  carvedilol (COREG) 12.5 MG tablet Take 12.5 mg by mouth 2 (two) times daily.  . Colchicine 0.6 MG CAPS Take 0.6 mg by mouth daily as needed.  . Febuxostat (ULORIC) 80 MG TABS Take 80 mg by mouth daily.  Marland Kitchen losartan (COZAAR) 100 MG tablet Take 100 mg by mouth daily.  . potassium chloride (K-DUR) 10 MEQ tablet Take 10 mEq by mouth daily.     Allergies:   Codeine; Dabigatran; Warfarin; and Xarelto [rivaroxaban]   Social History    Social History  . Marital status: Married    Spouse name: N/A  . Number of children: N/A  . Years of education: N/A   Social History Main Topics  . Smoking status: Never Smoker  . Smokeless tobacco: Never Used  . Alcohol use No  . Drug use: No  . Sexual activity: Not Asked   Other Topics Concern  . None   Social History Narrative  . None     Family History: The patient's family history includes Alcohol abuse in his father; COPD in his mother. ROS:   Please see the history of present illness.    All other systems reviewed and are negative.  EKGs/Labs/Other Studies Reviewed:    The following studies were reviewed today:  EKG:  EKG ordered today.  The ekg ordered today demonstrates Rate controlled atrial fibrillation Previous cardiac imaging requested [facility] Recent Labs: Requested from his PCP No results found for requested labs within last 8760 hours.  Recent Lipid Panel No results found for: CHOL, TRIG, HDL, CHOLHDL, VLDL, LDLCALC, LDLDIRECT  Physical Exam:    VS:  BP 126/82   Pulse 67   Ht 6' (1.829 m)   Wt 281 lb 6.4 oz (127.6 kg)   SpO2 92%   BMI 38.16 kg/m     Wt Readings from Last 3 Encounters:  03/14/17 281 lb 6.4 oz (127.6 kg)  02/26/11 258 lb 6.4 oz (117.2 kg)     GEN:  Well nourished, well developed in no acute distress HEENT: Normal NECK: No JVD; No carotid bruits LYMPHATICS: No lymphadenopathy CARDIAC: Irr irr RRR, no murmurs, rubs, gallops RESPIRATORY:  Clear to auscultation without rales, wheezing or rhonchi  ABDOMEN: Soft, non-tender, non-distended MUSCULOSKELETAL:  No edema; No deformity  SKIN: Warm and dry NEUROLOGIC:  Alert and oriented x 3 PSYCHIATRIC:  Normal affect    Signed, Shirlee More, MD  03/14/2017 5:11 PM    [facility]

## 2017-03-20 ENCOUNTER — Telehealth: Payer: Self-pay | Admitting: Cardiology

## 2017-03-20 ENCOUNTER — Other Ambulatory Visit: Payer: Self-pay

## 2017-03-20 MED ORDER — CARVEDILOL 12.5 MG PO TABS: 12.5000 mg | ORAL_TABLET | Freq: Two times a day (BID) | ORAL | 3 refills | Status: DC

## 2017-03-20 NOTE — Telephone Encounter (Signed)
Patient advised refill was just sent.

## 2017-03-20 NOTE — Telephone Encounter (Signed)
States at his office visit he told the nurse to call his carvadolol to [facility] but it has not been done

## 2017-05-07 ENCOUNTER — Encounter (INDEPENDENT_AMBULATORY_CARE_PROVIDER_SITE_OTHER): Payer: Self-pay

## 2017-05-07 ENCOUNTER — Ambulatory Visit (INDEPENDENT_AMBULATORY_CARE_PROVIDER_SITE_OTHER): Payer: BC Managed Care – PPO | Admitting: Podiatry

## 2017-05-07 ENCOUNTER — Encounter: Payer: Self-pay | Admitting: Podiatry

## 2017-05-07 VITALS — BP 144/112 | HR 82 | Ht 72.0 in | Wt 280.0 lb

## 2017-05-07 DIAGNOSIS — L600 Ingrowing nail: Secondary | ICD-10-CM | POA: Diagnosis not present

## 2017-05-07 NOTE — Progress Notes (Signed)
Subjective:    Patient ID: [name], male    DOB: 08-Jul-1956, 61 y.o.   MRN: [id number]  HPI  I think I have an ingrown toenail on my left big toe.  61 year old male presents today for a chief complaint of ingrown toenail to his left great toe. States that now ingrown for about a week. Has had this issue before his right great toe. Reports easy bruising and bleeding.  61 y.o. male presents with the above complaint.  Past Medical History:  Diagnosis Date  . Atrial fibrillation ([place]) 02/26/2011  . Chest pain   . CHF (congestive heart failure) ([place])   . Chronic anticoagulation 02/17/2015  . Chronic gout 02/21/2015  . ED (erectile dysfunction)   . Hypertension   . Hypertensive heart and chronic kidney disease with heart failure and stage 1 through stage 4 chronic kidney disease, or chronic kidney disease ([place]) 02/17/2015  . Hypertr obst cardiomyop   . Hypertrophic obstructive cardiomyopathy ([place]) 02/26/2011   Overview:  Last Assessment & Plan:  He is doing very well from a cardiac standpoint. He denies any episodes of chest pain. His systolic murmur is very quiet and I do not think that he has a significant outflow gradient at this time.  . Mitral regurgitation   . MVP (mitral valve prolapse)   . PAF (paroxysmal atrial fibrillation) ([place])    Past Surgical History:  Procedure Laterality Date  . CARDIOVASCULAR STRESS TEST  09/01/2002   EF 35%  . CHOLECYSTECTOMY    . TONSILLECTOMY    . TRANSESOPHAGEAL ECHOCARDIOGRAM  09/13/2008  . TRANSTHORACIC ECHOCARDIOGRAM  09/10/2008   EF 70-75%    Current Outpatient Prescriptions:  .  allopurinol (ZYLOPRIM) 300 MG tablet, , Disp: , Rfl:  .  amLODipine (NORVASC) 5 MG tablet, Take 1 tablet (5 mg total) by mouth daily., Disp: 90 tablet, Rfl: 3 .  aspirin 81 MG tablet, Take 81 mg by mouth daily.  , Disp: , Rfl:  .  calcium carbonate (CALCIUM 600) 600 MG TABS tablet, Take 600 mg by mouth daily., Disp: , Rfl:  .  carvedilol (COREG) 12.5 MG tablet, Take  1 tablet (12.5 mg total) by mouth 2 (two) times daily., Disp: 180 tablet, Rfl: 3 .  Colchicine 0.6 MG CAPS, Take 0.6 mg by mouth daily as needed., Disp: , Rfl:  .  Febuxostat (ULORIC) 80 MG TABS, Take 80 mg by mouth daily., Disp: , Rfl:  .  losartan (COZAAR) 100 MG tablet, Take 100 mg by mouth daily., Disp: , Rfl:  .  potassium chloride (K-DUR) 10 MEQ tablet, Take 10 mEq by mouth daily., Disp: , Rfl:   Allergies  Allergen Reactions  . Codeine Other (See Comments)    Unknown  . Dabigatran Other (See Comments)    unknown  . Warfarin Other (See Comments)    Unknown  . Xarelto [Rivaroxaban] Diarrhea    ? Diarrhea due to xarelto.       Review of Systems  Cardiovascular: Positive for leg swelling.  Hematological: Bruises/bleeds easily.  All other systems reviewed and are negative.      Objective:   Physical Exam Vitals:   05/07/17 1030  BP: (!) 144/112  Pulse: 82   General AA&O x3. Normal mood and affect.  Vascular Dorsalis pedis and posterior tibial pulses  present 1+ bilaterally  Capillary refill normal to all digits. Pedal hair growth diminished.  Neurologic Epicritic sensation present bilaterally.  Dermatologic No open lesions. Interspaces clear of maceration.  Normal skin temperature and turgor. Painful ingrowing nail at medial and lateral nail borders of the hallux nail left.  Orthopedic: MMT 5/5 in dorsiflexion, plantarflexion, inversion, and eversion. Normal lower extremity joint ROM without pain or crepitus.      Assessment & Plan:  Patient was evaluated and treated and all questions answered  Ingrown Nail, left -Patient elects to proceed with ingrown toenail removal today. Consent signed. -Ingrown nails avulsed. See procedure note. -Discussed with patient slight evidence of microcirculatory compromise which could delay healing. Will avoid phenol application today. -Educated on post-procedure care including soaking. Written instructions provided. -Patient to  follow up in 2 weeks for nail check.  Procedure: Avulsion of Ingrown Toenail Location: Left 1st toe Medial and lateral nail borders. Anesthesia: Lidocaine 1% plain; 1.27mL and Marcaine 0.5% plain; 1.70mL, digital block. Skin Prep: Alcohol. Dressing: Silvadene; telfa; dry, sterile, compression dressing. Technique: Following skin prep, the toe was exsanguinated and a tourniquet was secured at the base of the toe. The affected nail borders were freed, split with a nail splitter, and excised. The area was cleansed, tourniquet was then removed and sterile dressing applied. Disposition: Patient tolerated procedure well. Patient to return in 2 weeks for follow-up.

## 2017-05-07 NOTE — Patient Instructions (Signed)

## 2017-05-21 ENCOUNTER — Ambulatory Visit: Payer: BC Managed Care – PPO | Admitting: Podiatry

## 2018-02-20 ENCOUNTER — Other Ambulatory Visit: Payer: Self-pay

## 2018-02-20 ENCOUNTER — Telehealth: Payer: Self-pay | Admitting: Cardiology

## 2018-02-20 DIAGNOSIS — I10 Essential (primary) hypertension: Secondary | ICD-10-CM

## 2018-02-20 MED ORDER — AMLODIPINE BESYLATE 5 MG PO TABS: 5.0000 mg | ORAL_TABLET | Freq: Every day | ORAL | 1 refills | Status: DC

## 2018-02-20 NOTE — Telephone Encounter (Signed)
°*  STAT* If patient is at the pharmacy, call can be transferred to refill team.   1. Which medications need to be refilled? (please list name of each medication and dose if known) Amlodipine 5mg  takes 1 daily   2. Which pharmacy/location (including street and city if local pharmacy) is medication to be sent to?[facility]  3. Do they need a 30 day or 90 day supply? [place]

## 2018-02-20 NOTE — Telephone Encounter (Signed)
Refill sent.

## 2018-04-09 ENCOUNTER — Other Ambulatory Visit: Payer: Self-pay | Admitting: Cardiology

## 2018-05-19 NOTE — Progress Notes (Signed)
Cardiology Office Note:    Date:  05/20/2018   ID:  [name], DOB [birth year], MRN [id number]  PCP:  Nicoletta Dress, MD  Cardiologist:  Shirlee More, MD    Referring MD: Melony Overly, MD    ASSESSMENT:    1. Hypertrophic obstructive cardiomyopathy ([place])   2. Chronic a-fib ([place])   3. Hypertensive heart and chronic kidney disease with heart failure and stage 1 through stage 4 chronic kidney disease, or chronic kidney disease ([place])   4. Chronic anticoagulation    PLAN:    In order of problems listed above:  1. Stable asymptomatic having no chest pain shortness of breath or syncope however his edema could be a marker of left ventricular dysfunction echocardiogram ordered.  He will continue his beta-blocker 2. Stable rate controlled continue beta-blocker 3. Blood pressure is a target however he is developed marked lower extremity edema after increasing dose of his calcium channel blocker I have asked him to discontinue switch him from a low to high potency ARB and systolics remain elevated require additional therapy.  I will try to avoid loop or thiazide diuretics with his history of gout. 4. He is no longer anticoagulated for stroke prophylaxis   Next appointment: 3 months after testing and change in medications   Medication Adjustments/Labs and Tests Ordered: Current medicines are reviewed at length with the patient today.  Concerns regarding medicines are outlined above.  No orders of the defined types were placed in this encounter.  No orders of the defined types were placed in this encounter.   No chief complaint on file.   History of Present Illness:    [name] is a 62 y.o. male with a hx of obstructive hypertrophic cardiomyopathy, heart failure, chronic atrial fibrillation, hypertension and CKD last seen 03/14/17. Compliance with diet, lifestyle and medications: Yes  Last 6 months his weight is up about 30 pounds despite limiting salt and exercise  and trying to diet to lose weight.  He notes marked dependent edema lower extremities.  He walks 7 to 8 miles a week and has no exertional shortness of breath chest pain or no recurrent syncope on a beta-blocker with his obstructive hypertrophic cardiomyopathy.  He has no orthopnea.  I suspect he is having side effects from his calcium channel blocker is at risk for heart failure will undergo echocardiogram discontinue his calcium channel blocker.  His atrial fibrillation is asymptomatic no palpitation TIA and no longer is anticoagulated taking low-dose aspirin for stroke prophylaxis Past Medical History:  Diagnosis Date  . Atrial fibrillation ([place]) 02/26/2011  . Chest pain   . CHF (congestive heart failure) ([place])   . Chronic anticoagulation 02/17/2015  . Chronic gout 02/21/2015  . ED (erectile dysfunction)   . Hypertension   . Hypertensive heart and chronic kidney disease with heart failure and stage 1 through stage 4 chronic kidney disease, or chronic kidney disease ([place]) 02/17/2015  . Hypertr obst cardiomyop   . Hypertrophic obstructive cardiomyopathy ([place]) 02/26/2011   Overview:  Last Assessment & Plan:  He is doing very well from a cardiac standpoint. He denies any episodes of chest pain. His systolic murmur is very quiet and I do not think that he has a significant outflow gradient at this time.  . Mitral regurgitation   . MVP (mitral valve prolapse)   . PAF (paroxysmal atrial fibrillation) ([place])     Past Surgical History:  Procedure Laterality Date  . CARDIOVASCULAR STRESS TEST  09/01/2002  EF 35%  . CHOLECYSTECTOMY    . TONSILLECTOMY    . TRANSESOPHAGEAL ECHOCARDIOGRAM  09/13/2008  . TRANSTHORACIC ECHOCARDIOGRAM  09/10/2008   EF 70-75%    Current Medications: Current Meds  Medication Sig  . allopurinol (ZYLOPRIM) 300 MG tablet Take 300 mg by mouth daily.   Marland Kitchen amLODipine (NORVASC) 10 MG tablet Take 10 mg by mouth daily.  Marland Kitchen aspirin 81 MG tablet Take 81 mg by mouth daily.    . calcium  carbonate (CALCIUM 600) 600 MG TABS tablet Take 600 mg by mouth daily.  . carvedilol (COREG) 12.5 MG tablet TAKE 1 TABLET BY MOUTH TWICE DAILY  . Colchicine 0.6 MG CAPS Take 0.6 mg by mouth daily as needed.  Marland Kitchen losartan (COZAAR) 100 MG tablet Take 100 mg by mouth daily.  . potassium chloride (K-DUR) 10 MEQ tablet Take 10 mEq by mouth daily.     Allergies:   Codeine; Dabigatran; Warfarin; and Xarelto [rivaroxaban]   Social History   Socioeconomic History  . Marital status: Married    Spouse name: Not on file  . Number of children: Not on file  . Years of education: Not on file  . Highest education level: Not on file  Occupational History  . Not on file  Social Needs  . Financial resource strain: Not on file  . Food insecurity:    Worry: Not on file    Inability: Not on file  . Transportation needs:    Medical: Not on file    Non-medical: Not on file  Tobacco Use  . Smoking status: Never Smoker  . Smokeless tobacco: Never Used  Substance and Sexual Activity  . Alcohol use: No  . Drug use: No  . Sexual activity: Not on file  Lifestyle  . Physical activity:    Days per week: Not on file    Minutes per session: Not on file  . Stress: Not on file  Relationships  . Social connections:    Talks on phone: Not on file    Gets together: Not on file    Attends religious service: Not on file    Active member of club or organization: Not on file    Attends meetings of clubs or organizations: Not on file    Relationship status: Not on file  Other Topics Concern  . Not on file  Social History Narrative  . Not on file     Family History: The patient's family history includes Alcohol abuse in his father; COPD in his mother. ROS:   Please see the history of present illness.    All other systems reviewed and are negative.  EKGs/Labs/Other Studies Reviewed:    The following studies were reviewed today:  EKG:  EKG ordered today.  The ekg ordered today demonstrates patient  atrial fibrillation controlled rate  Recent Labs: 03/25/2018 creatinine normal 0.99 potassium 4.1 last cholesterol 07/02/2017 total 151 LDL 88 HDL 30 A1c 5.3 No results found for requested labs within last 8760 hours.  Recent Lipid Panel No results found for: CHOL, TRIG, HDL, CHOLHDL, VLDL, LDLCALC, LDLDIRECT  Physical Exam:    VS:  BP (!) 142/96 (BP Location: Right Arm, Patient Position: Sitting, Cuff Size: Large)   Pulse 64   Ht 6' (1.829 m)   Wt 298 lb (135.2 kg)   SpO2 94%   BMI 40.42 kg/m     Wt Readings from Last 3 Encounters:  05/20/18 298 lb (135.2 kg)  05/07/17 280 lb (127 kg)  03/14/17  281 lb 6.4 oz (127.6 kg)     GEN:  Well nourished, well developed in no acute distress HEENT: Normal NECK: No JVD; No carotid bruits LYMPHATICS: No lymphadenopathy CARDIAC: Variable first heart sound irregular rhythm RRR, no murmurs, rubs, gallops RESPIRATORY:  Clear to auscultation without rales, wheezing or rhonchi  ABDOMEN: Soft, non-tender, non-distended MUSCULOSKELETAL: 4+ doughy lower extremity edema ankle to knee edema; No deformity  SKIN: Warm and dry NEUROLOGIC:  Alert and oriented x 3 PSYCHIATRIC:  Normal affect    Signed, Shirlee More, MD  05/20/2018 10:03 AM    [facility]

## 2018-05-20 ENCOUNTER — Ambulatory Visit (INDEPENDENT_AMBULATORY_CARE_PROVIDER_SITE_OTHER): Payer: Self-pay | Admitting: Cardiology

## 2018-05-20 ENCOUNTER — Ambulatory Visit: Payer: BC Managed Care – PPO | Admitting: Cardiology

## 2018-05-20 VITALS — BP 142/96 | HR 64 | Ht 72.0 in | Wt 298.0 lb

## 2018-05-20 DIAGNOSIS — I421 Obstructive hypertrophic cardiomyopathy: Secondary | ICD-10-CM

## 2018-05-20 DIAGNOSIS — I482 Chronic atrial fibrillation, unspecified: Secondary | ICD-10-CM

## 2018-05-20 DIAGNOSIS — I130 Hypertensive heart and chronic kidney disease with heart failure and stage 1 through stage 4 chronic kidney disease, or unspecified chronic kidney disease: Secondary | ICD-10-CM

## 2018-05-20 DIAGNOSIS — Z7901 Long term (current) use of anticoagulants: Secondary | ICD-10-CM

## 2018-05-20 MED ORDER — TELMISARTAN 40 MG PO TABS: 40.0000 mg | ORAL_TABLET | Freq: Every day | ORAL | 3 refills | Status: DC

## 2018-05-20 NOTE — Patient Instructions (Addendum)
Medication Instructions:  Your physician has recommended you make the following change in your medication:   STOP losartan STOP amlodipine  START telmisartan (micardis) 40 mg daily  If you need a refill on your cardiac medications before your next appointment, please call your pharmacy.   Lab work: None  Testing/Procedures:   Your physician has requested that you have an echocardiogram. Echocardiography is a painless test that uses sound waves to create images of your heart. It provides your doctor with information about the size and shape of your heart and how well your heart's chambers and valves are working. This procedure takes approximately one hour. There are no restrictions for this procedure.   Follow-Up: At [facility], you and your health needs are our priority.  As part of our continuing mission to provide you with exceptional heart care, we have created designated Provider Care Teams.  These Care Teams include your primary Cardiologist (physician) and Advanced Practice Providers (APPs -  Physician Assistants and Nurse Practitioners) who all work together to provide you with the care you need, when you need it. You will need a follow up appointment in 3 months.  Please call our office 2 months in advance to schedule this appointment.    Any Other Special Instructions Will Be Listed Below (If Applicable). Check your blood pressure daily at home at the same time each day and keep a log of these readings. Please contact our office if the top number of your blood pressure is >140 2 weeks after stopping amlodipine.    Telmisartan tablets What is this medicine? TELMISARTAN (tel mi SAR tan) is used to treat high blood pressure. This medicine may be used for other purposes; ask your health care provider or pharmacist if you have questions. COMMON BRAND NAME(S): Micardis What should I tell my health care provider before I take this medicine? They need to know if you have any of  these conditions: -if you are on a special diet, such as a low-salt diet -kidney or liver disease -an unusual or allergic reaction to telmisartan, other medicines, foods, dyes, or preservatives -pregnant or trying to get pregnant -breast-feeding How should I use this medicine? Take this medicine by mouth with a glass of water. Follow the directions on the prescription label. This medicine can be taken with or without food. Take your doses at regular intervals. Do not take your medicine more often than directed. Talk to your pediatrician regarding the use of this medicine in children. Special care may be needed. Overdosage: If you think you have taken too much of this medicine contact a poison control center or emergency room at once. NOTE: This medicine is only for you. Do not share this medicine with others. What if I miss a dose? If you miss a dose, take it as soon as you can. If it is almost time for your next dose, take only that dose. Do not take double or extra doses. What may interact with this medicine? -digoxin -potassium salts or potassium supplements -warfarin This list may not describe all possible interactions. Give your health care provider a list of all the medicines, herbs, non-prescription drugs, or dietary supplements you use. Also tell them if you smoke, drink alcohol, or use illegal drugs. Some items may interact with your medicine. What should I watch for while using this medicine? Visit your doctor or health care professional for regular checks on your progress. Check your blood pressure as directed. Ask your doctor or health care professional  what your blood pressure should be and when you should contact him or her. Call your doctor or health care professional if you notice an irregular or fast heart beat. Women should inform their doctor if they wish to become pregnant or think they might be pregnant. There is a potential for serious side effects to an unborn child,  particularly in the second or third trimester. Talk to your health care professional or pharmacist for more information. You may get drowsy or dizzy. Do not drive, use machinery, or do anything that needs mental alertness until you know how this drug affects you. Do not stand or sit up quickly, especially if you are an older patient. This reduces the risk of dizzy or fainting spells. Alcohol can make you more drowsy and dizzy. Avoid alcoholic drinks. Avoid salt substitutes unless you are told otherwise by your doctor or health care professional. Do not treat yourself for coughs, colds, or pain while you are taking this medicine without asking your doctor or health care professional for advice. Some ingredients may increase your blood pressure. What side effects may I notice from receiving this medicine? Side effects that you should report to your doctor or health care professional as soon as possible: -allergic reactions like skin rash, itching or hives, swelling of the face, lips, or tongue -breathing problems -dark urine -gout pain -muscle pains -slow heartbeat -trouble passing urine or change in the amount of urine -unusual bleeding or bruising -yellowing of the eyes or skin Side effects that usually do not require medical attention (report to your doctor or health care professional if they continue or are bothersome): -back pain -change in sex drive or performance -diarrhea -sore throat or stuffy nose This list may not describe all possible side effects. Call your doctor for medical advice about side effects. You may report side effects to FDA at 1-800-FDA-1088. Where should I keep my medicine? Keep out of the reach of children. Store at room temperature between 15 and 30 degrees C (59 and 86 degrees F). Tablets should not be removed from the blisters until right before use. Throw away any unused medicine after the expiration date. NOTE: This sheet is a summary. It may not cover all  possible information. If you have questions about this medicine, talk to your doctor, pharmacist, or health care provider.  2018 Elsevier/Gold Standard (2007-10-08 13:39:10)   Echocardiogram An echocardiogram, or echocardiography, uses sound waves (ultrasound) to produce an image of your heart. The echocardiogram is simple, painless, obtained within a short period of time, and offers valuable information to your health care provider. The images from an echocardiogram can provide information such as:  Evidence of coronary artery disease (CAD).  Heart size.  Heart muscle function.  Heart valve function.  Aneurysm detection.  Evidence of a past heart attack.  Fluid buildup around the heart.  Heart muscle thickening.  Assess heart valve function.  Tell a health care provider about:  Any allergies you have.  All medicines you are taking, including vitamins, herbs, eye drops, creams, and over-the-counter medicines.  Any problems you or family members have had with anesthetic medicines.  Any blood disorders you have.  Any surgeries you have had.  Any medical conditions you have.  Whether you are pregnant or may be pregnant. What happens before the procedure? No special preparation is needed. Eat and drink normally. What happens during the procedure?  In order to produce an image of your heart, gel will be applied to your chest  and a wand-like tool (transducer) will be moved over your chest. The gel will help transmit the sound waves from the transducer. The sound waves will harmlessly bounce off your heart to allow the heart images to be captured in real-time motion. These images will then be recorded.  You may need an IV to receive a medicine that improves the quality of the pictures. What happens after the procedure? You may return to your normal schedule including diet, activities, and medicines, unless your health care provider tells you otherwise. This information is  not intended to replace advice given to you by your health care provider. Make sure you discuss any questions you have with your health care provider. Document Released: 07/20/2000 Document Revised: 03/10/2016 Document Reviewed: 03/30/2013 Elsevier Interactive Patient Education  2017 [employer].

## 2018-06-03 ENCOUNTER — Telehealth: Payer: Self-pay | Admitting: Cardiology

## 2018-06-03 DIAGNOSIS — I130 Hypertensive heart and chronic kidney disease with heart failure and stage 1 through stage 4 chronic kidney disease, or unspecified chronic kidney disease: Secondary | ICD-10-CM

## 2018-06-03 NOTE — Telephone Encounter (Signed)
Left voicemail for the patient to call the office. 

## 2018-06-03 NOTE — Telephone Encounter (Signed)
Patient would like a return call at [phone] to discuss side effects from the medicine just put him on

## 2018-06-03 NOTE — Telephone Encounter (Signed)
Per the patient he has been experiencing high BP since starting the telmisartan. Systolic has been any where from 156 to 153, diastolic has been 79-43. Patient states that when he takes his BP he has to sit and deep breath to allow the BP to come down. Pulse has been normal per patient. Swelling has improved. Patient has noticed more headaches lately and has been relieving with tylenol.

## 2018-06-04 MED ORDER — SPIRONOLACTONE 25 MG PO TABS: 25.0000 mg | ORAL_TABLET | Freq: Every day | ORAL | 3 refills | Status: DC

## 2018-06-04 NOTE — Telephone Encounter (Signed)
Patient called this morning with concern regarding elevated blood pressure since medications changes were made during last office visit on 05/20/18. Patient states his blood pressure has been ranging from 140-170/80s since starting telmisartan 40 mg daily and stopping losartan and amlodipine. Will have Dr. Bettina Gavia refer to Caryl Pina, RN's note from yesterday and advise.

## 2018-06-04 NOTE — Telephone Encounter (Signed)
Patient informed that Dr. Bettina Gavia advised to start spironolactone 25 mg daily. Prescription sent to [facility] as requested. Patient advised to take his blood pressure twice daily at the same time each day and keep a log of these readings. Patient will return to the [facility] office for a nurse visit for a blood pressure check and lab work on Thursday, 06/12/18 at 8:20 am. Patient will bring his blood pressure log for Dr. Bettina Gavia to review. No further questions.

## 2018-06-12 ENCOUNTER — Telehealth: Payer: Self-pay

## 2018-06-12 ENCOUNTER — Ambulatory Visit (INDEPENDENT_AMBULATORY_CARE_PROVIDER_SITE_OTHER): Payer: Self-pay | Admitting: Cardiology

## 2018-06-12 ENCOUNTER — Other Ambulatory Visit: Payer: Self-pay

## 2018-06-12 VITALS — BP 182/100 | HR 72 | Ht 72.0 in | Wt 302.4 lb

## 2018-06-12 DIAGNOSIS — I130 Hypertensive heart and chronic kidney disease with heart failure and stage 1 through stage 4 chronic kidney disease, or unspecified chronic kidney disease: Secondary | ICD-10-CM

## 2018-06-12 MED ORDER — CLONIDINE HCL 0.1 MG PO TABS: 0.1000 mg | ORAL_TABLET | Freq: Two times a day (BID) | ORAL | 0 refills | Status: DC

## 2018-06-12 NOTE — Telephone Encounter (Signed)
Patient was informed of the new instructions given by Dr. Bettina Gavia. Orders have been placed.

## 2018-06-12 NOTE — Progress Notes (Signed)
Patient presented today for BP check due to recent med change. Per the patient his BP has steady been increasing, while his swelling is improving. BP is elevated today. Per Dr. Geraldo Pitter patient is to resume norvasc at 5 mg q daily and come in for BP check in 1 week. Will route to Dr. Bettina Gavia for advisement regarding ARB due to this medication being expensive while patient does not have insurance. Patient will bring home BP machine to compare at next visit. BP log presented by patient today will be scanned into the chart for review.

## 2018-06-12 NOTE — Patient Instructions (Signed)
Medication Instructions:  Your physician has recommended you make the following change in your medication:  START amlodipine 5 mg daily  If you need a refill on your cardiac medications before your next appointment, please call your pharmacy.   Lab work: Your physician recommends that you have the following labs drawn: BMP today  If you have labs (blood work) drawn today and your tests are completely normal, you will receive your results only by: Marland Kitchen MyChart Message (if you have MyChart) OR . A paper copy in the mail If you have any lab test that is abnormal or we need to change your treatment, we will call you to review the results.  Testing/Procedures: None  Follow-Up: At [facility], you and your health needs are our priority.  As part of our continuing mission to provide you with exceptional heart care, we have created designated Provider Care Teams.  These Care Teams include your primary Cardiologist (physician) and Advanced Practice Providers (APPs -  Physician Assistants and Nurse Practitioners) who all work together to provide you with the care you need, when you need it.  . You will need a follow up appointment in 7 days with a nurse for blood pressure check.   Any Other Special Instructions Will Be Listed Below (If Applicable).  Please bring your blood pressure machine for this visit to compare.

## 2018-06-13 LAB — BASIC METABOLIC PANEL
BUN/Creatinine Ratio: 13 (ref 10–24)
BUN: 16 mg/dL (ref 8–27)
CHLORIDE: 103 mmol/L (ref 96–106)
CO2: 25 mmol/L (ref 20–29)
Calcium: 9.6 mg/dL (ref 8.6–10.2)
Creatinine, Ser: 1.28 mg/dL — ABNORMAL HIGH (ref 0.76–1.27)
GFR calc Af Amer: 68 mL/min/{1.73_m2} (ref >59)
GFR, EST NON AFRICAN AMERICAN: 59 mL/min/{1.73_m2} — AB (ref >59)
Glucose: 103 mg/dL — ABNORMAL HIGH (ref 65–99)
Potassium: 5 mmol/L (ref 3.5–5.2)
SODIUM: 144 mmol/L (ref 134–144)

## 2018-06-19 ENCOUNTER — Ambulatory Visit: Payer: Self-pay

## 2018-06-25 ENCOUNTER — Telehealth: Payer: Self-pay | Admitting: Cardiology

## 2018-06-25 NOTE — Telephone Encounter (Signed)
Patient states his blood pressure continues to be elevated ranging from 140-150/90-100. Patient confirms checking his blood pressure at the same time every day. Patient is currently taking telmisartan 40 mg daily, spironolactone 25 mg daily, clonidine 0.1 mg twice daily, and coreg 12.5 mg twice daily. Patient is scheduled for an office visit on 07/02/18 at 8:20 am in the [place] office. Will have Dr. Bettina Gavia advise of further recommendations.

## 2018-06-25 NOTE — Telephone Encounter (Signed)
Has questions about his medications  °

## 2018-06-26 MED ORDER — CARVEDILOL 12.5 MG PO TABS: 25.0000 mg | ORAL_TABLET | Freq: Two times a day (BID) | ORAL | Status: DC

## 2018-06-26 NOTE — Telephone Encounter (Signed)
Increase his coreg to 25 BID, be sure he is using a pill box and sodium restriction

## 2018-06-26 NOTE — Telephone Encounter (Signed)
Patient informed to increase carvedilol from 12.5 mg to 25 mg twice daily. Patient denies needing a refill as has enough medication to get to his follow up appointment next week. Patient confirmed that he uses a pill box to organize his medications and he is limiting his salt intake. Education provided on low salt diet. Patient verbalized understanding. No further questions.

## 2018-06-26 NOTE — Addendum Note (Signed)
Addended by: Austin Miles on: 06/26/2018 12:24 PM   Modules accepted: Orders

## 2018-07-01 NOTE — Progress Notes (Signed)
Cardiology Office Note:    Date:  07/02/2018   ID:  [name], DOB May 09, 1956, MRN [id number]  PCP:  Nicoletta Dress, MD  Cardiologist:  Shirlee More, MD    Referring MD: Nicoletta Dress, MD    ASSESSMENT:    1. Hypertensive heart and chronic kidney disease with heart failure and stage 1 through stage 4 chronic kidney disease, or chronic kidney disease ([place])   2. Chronic a-fib ([place])   3. Hypertrophic obstructive cardiomyopathy ([place])   4. Chronic gout without tophus, unspecified cause, unspecified site    PLAN:    In order of problems listed above:  1. Blood pressure is at target he will continue his current medication stop his oral potassium and recheck electrolytes today with CKD and multiple drugs with the potential for hyperkalemia.  I did ask him to reduce dietary potassium in general. 2. Stable rate controlled 3. Stable asymptomatic 4. Stable he has had no recurrence on distal diuretic   Next appointment: 6 months   Medication Adjustments/Labs and Tests Ordered: Current medicines are reviewed at length with the patient today.  Concerns regarding medicines are outlined above.  Orders Placed This Encounter  Procedures  . Basic Metabolic Panel (BMET)   Meds ordered this encounter  Medications  . carvedilol (COREG) 25 MG tablet    Sig: Take 1 tablet (25 mg total) by mouth 2 (two) times daily.    Dispense:  180 tablet    Refill:  1    Will send more refills once the patient follows up    Chief Complaint  Patient presents with  . Follow-up    after medication changes  . Hypertension    History of Present Illness:    [name] is a 62 y.o. male with a hx of obstructive hypertrophic cardiomyopathy, heart failure, chronic atrial fibrillation, hypertension and CKD  last seen 05/20/18. Compliance with diet, lifestyle and medications: Yes  He tolerates his distal diuretic without a flare of gout and has had no chest pain shortness of breath or  syncope.  Home blood pressures run less than 140/90 Past Medical History:  Diagnosis Date  . Atrial fibrillation ([place]) 02/26/2011  . Chest pain   . CHF (congestive heart failure) ([place])   . Chronic anticoagulation 02/17/2015  . Chronic gout 02/21/2015  . ED (erectile dysfunction)   . Hypertension   . Hypertensive heart and chronic kidney disease with heart failure and stage 1 through stage 4 chronic kidney disease, or chronic kidney disease ([place]) 02/17/2015  . Hypertr obst cardiomyop   . Hypertrophic obstructive cardiomyopathy ([place]) 02/26/2011   Overview:  Last Assessment & Plan:  He is doing very well from a cardiac standpoint. He denies any episodes of chest pain. His systolic murmur is very quiet and I do not think that he has a significant outflow gradient at this time.  . Mitral regurgitation   . MVP (mitral valve prolapse)   . PAF (paroxysmal atrial fibrillation) ([place])     Past Surgical History:  Procedure Laterality Date  . CARDIOVASCULAR STRESS TEST  09/01/2002   EF 35%  . CHOLECYSTECTOMY    . TONSILLECTOMY    . TRANSESOPHAGEAL ECHOCARDIOGRAM  09/13/2008  . TRANSTHORACIC ECHOCARDIOGRAM  09/10/2008   EF 70-75%    Current Medications: Current Meds  Medication Sig  . allopurinol (ZYLOPRIM) 300 MG tablet Take 300 mg by mouth daily.   Marland Kitchen aspirin 81 MG tablet Take 81 mg by mouth daily.    Marland Kitchen  calcium carbonate (CALCIUM 600) 600 MG TABS tablet Take 600 mg by mouth daily.  . carvedilol (COREG) 25 MG tablet Take 1 tablet (25 mg total) by mouth 2 (two) times daily.  . cloNIDine (CATAPRES) 0.1 MG tablet Take 1 tablet (0.1 mg total) by mouth 2 (two) times daily.  . Colchicine 0.6 MG CAPS Take 0.6 mg by mouth daily as needed.  . meclizine (ANTIVERT) 25 MG tablet Take 1 tablet by mouth daily as needed.  Marland Kitchen spironolactone (ALDACTONE) 25 MG tablet Take 1 tablet (25 mg total) by mouth daily.  Marland Kitchen telmisartan (MICARDIS) 40 MG tablet Take 1 tablet (40 mg total) by mouth daily.  . [DISCONTINUED]  carvedilol (COREG) 12.5 MG tablet Take 2 tablets (25 mg total) by mouth 2 (two) times daily.  . [DISCONTINUED] potassium chloride (K-DUR) 10 MEQ tablet Take 10 mEq by mouth daily.     Allergies:   Codeine; Dabigatran; Warfarin; and Xarelto [rivaroxaban]   Social History   Socioeconomic History  . Marital status: Married    Spouse name: Not on file  . Number of children: Not on file  . Years of education: Not on file  . Highest education level: Not on file  Occupational History  . Not on file  Social Needs  . Financial resource strain: Not on file  . Food insecurity:    Worry: Not on file    Inability: Not on file  . Transportation needs:    Medical: Not on file    Non-medical: Not on file  Tobacco Use  . Smoking status: Never Smoker  . Smokeless tobacco: Never Used  Substance and Sexual Activity  . Alcohol use: No  . Drug use: No  . Sexual activity: Not on file  Lifestyle  . Physical activity:    Days per week: Not on file    Minutes per session: Not on file  . Stress: Not on file  Relationships  . Social connections:    Talks on phone: Not on file    Gets together: Not on file    Attends religious service: Not on file    Active member of club or organization: Not on file    Attends meetings of clubs or organizations: Not on file    Relationship status: Not on file  Other Topics Concern  . Not on file  Social History Narrative  . Not on file     Family History: The patient's family history includes Alcohol abuse in his father; COPD in his mother. ROS:   Please see the history of present illness.    All other systems reviewed and are negative.  EKGs/Labs/Other Studies Reviewed:    The following studies were reviewed today:   Recent Labs: 06/12/2018: BUN 16; Creatinine, Ser 1.28; Potassium 5.0; Sodium 144  Recent Lipid Panel No results found for: CHOL, TRIG, HDL, CHOLHDL, VLDL, LDLCALC, LDLDIRECT  Physical Exam:    VS:  BP 134/90 (BP Location: Right  Arm, Patient Position: Sitting, Cuff Size: Normal)   Pulse 62   Ht 6' (1.829 m)   Wt 300 lb (136.1 kg)   BMI 40.69 kg/m     Wt Readings from Last 3 Encounters:  07/02/18 300 lb (136.1 kg)  06/12/18 (!) 302 lb 6.4 oz (137.2 kg)  05/20/18 298 lb (135.2 kg)     GEN:  Well nourished, well developed in no acute distress HEENT: Normal NECK: No JVD; No carotid bruits LYMPHATICS: No lymphadenopathy CARDIAC: RRR, no murmurs, rubs, gallops RESPIRATORY:  Clear to auscultation without rales, wheezing or rhonchi  ABDOMEN: Soft, non-tender, non-distended MUSCULOSKELETAL:  No edema; No deformity  SKIN: Warm and dry NEUROLOGIC:  Alert and oriented x 3 PSYCHIATRIC:  Normal affect    Signed, Shirlee More, MD  07/02/2018 9:02 AM    [place]

## 2018-07-02 ENCOUNTER — Ambulatory Visit (INDEPENDENT_AMBULATORY_CARE_PROVIDER_SITE_OTHER): Payer: Self-pay | Admitting: Cardiology

## 2018-07-02 VITALS — BP 134/90 | HR 62 | Ht 72.0 in | Wt 300.0 lb

## 2018-07-02 DIAGNOSIS — I130 Hypertensive heart and chronic kidney disease with heart failure and stage 1 through stage 4 chronic kidney disease, or unspecified chronic kidney disease: Secondary | ICD-10-CM

## 2018-07-02 DIAGNOSIS — I421 Obstructive hypertrophic cardiomyopathy: Secondary | ICD-10-CM

## 2018-07-02 DIAGNOSIS — I482 Chronic atrial fibrillation, unspecified: Secondary | ICD-10-CM

## 2018-07-02 DIAGNOSIS — M1A9XX0 Chronic gout, unspecified, without tophus (tophi): Secondary | ICD-10-CM

## 2018-07-02 LAB — BASIC METABOLIC PANEL
BUN/Creatinine Ratio: 13 (ref 10–24)
BUN: 17 mg/dL (ref 8–27)
CO2: 22 mmol/L (ref 20–29)
CREATININE: 1.3 mg/dL — AB (ref 0.76–1.27)
Calcium: 9.6 mg/dL (ref 8.6–10.2)
Chloride: 100 mmol/L (ref 96–106)
GFR, EST AFRICAN AMERICAN: 67 mL/min/{1.73_m2} (ref >59)
GFR, EST NON AFRICAN AMERICAN: 58 mL/min/{1.73_m2} — AB (ref >59)
Glucose: 98 mg/dL (ref 65–99)
Potassium: 4.7 mmol/L (ref 3.5–5.2)
SODIUM: 138 mmol/L (ref 134–144)

## 2018-07-02 MED ORDER — CARVEDILOL 25 MG PO TABS: 25.0000 mg | ORAL_TABLET | Freq: Two times a day (BID) | ORAL | 1 refills | Status: DC

## 2018-07-02 NOTE — Patient Instructions (Addendum)
Medication Instructions:  Your physician has recommended you make the following change in your medication:  STOP potassium  If you need a refill on your cardiac medications before your next appointment, please call your pharmacy.   Lab work: Your physician recommends that you return for lab work today: BMP.   If you have labs (blood work) drawn today and your tests are completely normal, you will receive your results only by: Marland Kitchen MyChart Message (if you have MyChart) OR . A paper copy in the mail If you have any lab test that is abnormal or we need to change your treatment, we will call you to review the results.  Testing/Procedures: None  Follow-Up: At [facility], you and your health needs are our priority.  As part of our continuing mission to provide you with exceptional heart care, we have created designated Provider Care Teams.  These Care Teams include your primary Cardiologist (physician) and Advanced Practice Providers (APPs -  Physician Assistants and Nurse Practitioners) who all work together to provide you with the care you need, when you need it. You will need a follow up appointment in 6 months.  Please call our office 2 months in advance to schedule this appointment.        Potassium Content of Foods Potassium is a mineral found in many foods and drinks. It helps keep fluids and minerals balanced in your body and affects how steadily your heart beats. Potassium also helps control your blood pressure and keep your muscles and nervous system healthy. Certain health conditions and medicines may change the balance of potassium in your body. When this happens, you can help balance your level of potassium through the foods that you do or do not eat. Your health care provider or dietitian may recommend an amount of potassium that you should have each day. The following lists of foods provide the amount of potassium (in parentheses) per serving in each item. High in  potassium The following foods and beverages have 200 mg or more of potassium per serving:  Apricots, 2 raw or 5 dry (200 mg).  Artichoke, 1 medium (345 mg).  Avocado, raw,  each (245 mg).  Banana, 1 medium (425 mg).  Beans, lima, or baked beans, canned,  cup (280 mg).  Beans, white, canned,  cup (595 mg).  Beef roast, 3 oz (320 mg).  Beef, ground, 3 oz (270 mg).  Beets, raw or cooked,  cup (260 mg).  Bran muffin, 2 oz (300 mg).  Broccoli,  cup (230 mg).  Brussels sprouts,  cup (250 mg).  Cantaloupe,  cup (215 mg).  Cereal, 100% bran,  cup (200-400 mg).  Cheeseburger, single, fast food, 1 each (225-400 mg).  Chicken, 3 oz (220 mg).  Clams, canned, 3 oz (535 mg).  Crab, 3 oz (225 mg).  Dates, 5 each (270 mg).  Dried beans and peas,  cup (300-475 mg).  Figs, dried, 2 each (260 mg).  Fish: halibut, tuna, cod, snapper, 3 oz (480 mg).  Fish: salmon, haddock, swordfish, perch, 3 oz (300 mg).  Fish, tuna, canned 3 oz (200 mg).  [place] fries, fast food, 3 oz (470 mg).  Granola with fruit and nuts,  cup (200 mg).  Grapefruit juice,  cup (200 mg).  Greens, beet,  cup (655 mg).  Honeydew melon,  cup (200 mg).  Kale, raw, 1 cup (300 mg).  Kiwi, 1 medium (240 mg).  Kohlrabi, rutabaga, parsnips,  cup (280 mg).  Lentils,  cup (365 mg).  Mango, 1 each (325 mg).  Milk, chocolate, 1 cup (420 mg).  Milk: nonfat, low-fat, whole, buttermilk, 1 cup (350-380 mg).  Molasses, 1 Tbsp (295 mg).  Mushrooms,  cup (280) mg.  Nectarine, 1 each (275 mg).  Nuts: almonds, peanuts, hazelnuts, [place], cashew, mixed, 1 oz (200 mg).  Nuts, pistachios, 1 oz (295 mg).  Orange, 1 each (240 mg).  Orange juice,  cup (235 mg).  Papaya, medium,  fruit (390 mg).  Peanut butter, chunky, 2 Tbsp (240 mg).  Peanut butter, smooth, 2 Tbsp (210 mg).  Pear, 1 medium (200 mg).  Pomegranate, 1 whole (400 mg).  Pomegranate juice,  cup (215 mg).  Pork, 3  oz (350 mg).  Potato chips, salted, 1 oz (465 mg).  Potato, baked with skin, 1 medium (925 mg).  Potatoes, boiled,  cup (255 mg).  Potatoes, mashed,  cup (330 mg).  Prune juice,  cup (370 mg).  Prunes, 5 each (305 mg).  Pudding, chocolate,  cup (230 mg).  Pumpkin, canned,  cup (250 mg).  Raisins, seedless,  cup (270 mg).  Seeds, sunflower or pumpkin, 1 oz (240 mg).  Soy milk, 1 cup (300 mg).  Spinach,  cup (420 mg).  Spinach, canned,  cup (370 mg).  Sweet potato, baked with skin, 1 medium (450 mg).  Swiss chard,  cup (480 mg).  Tomato or vegetable juice,  cup (275 mg).  Tomato sauce or puree,  cup (400-550 mg).  Tomato, raw, 1 medium (290 mg).  Tomatoes, canned,  cup (200-300 mg).  [place], 3 oz (250 mg).  Wheat germ, 1 oz (250 mg).  Winter squash,  cup (250 mg).  Yogurt, plain or fruited, 6 oz (260-435 mg).  Zucchini,  cup (220 mg).  Moderate in potassium The following foods and beverages have 50-200 mg of potassium per serving:  Apple, 1 each (150 mg).  Apple juice,  cup (150 mg).  Applesauce,  cup (90 mg).  Apricot nectar,  cup (140 mg).  Asparagus, small spears,  cup or 6 spears (155 mg).  Bagel, cinnamon raisin, 1 each (130 mg).  Bagel, egg or plain, 4 in., 1 each (70 mg).  Beans, green,  cup (90 mg).  Beans, yellow,  cup (190 mg).  Beer, regular, 12 oz (100 mg).  Beets, canned,  cup (125 mg).  Blackberries,  cup (115 mg).  Blueberries,  cup (60 mg).  Bread, whole wheat, 1 slice (70 mg).  Broccoli, raw,  cup (145 mg).  Cabbage,  cup (150 mg).  Carrots, cooked or raw,  cup (180 mg).  Cauliflower, raw,  cup (150 mg).  Celery, raw,  cup (155 mg).  Cereal, bran flakes, cup (120-150 mg).  Cheese, cottage,  cup (110 mg).  Cherries, 10 each (150 mg).  Chocolate, 1 oz bar (165 mg).  Coffee, brewed 6 oz (90 mg).  Corn,  cup or 1 ear (195 mg).  Cucumbers,  cup (80 mg).  Egg, large, 1  each (60 mg).  Eggplant,  cup (60 mg).  Endive, raw, cup (80 mg).  English muffin, 1 each (65 mg).  Fish, orange roughy, 3 oz (150 mg).  Frankfurter, beef or pork, 1 each (75 mg).  Fruit cocktail,  cup (115 mg).  Grape juice,  cup (170 mg).  Grapefruit,  fruit (175 mg).  Grapes,  cup (155 mg).  Greens: kale, turnip, collard,  cup (110-150 mg).  Ice cream or frozen yogurt, chocolate,  cup (175 mg).  Ice cream or frozen yogurt,  vanilla,  cup (120-150 mg).  Lemons, limes, 1 each (80 mg).  Lettuce, all types, 1 cup (100 mg).  Mixed vegetables,  cup (150 mg).  Mushrooms, raw,  cup (110 mg).  Nuts: walnuts, pecans, or macadamia, 1 oz (125 mg).  Oatmeal,  cup (80 mg).  Okra,  cup (110 mg).  Onions, raw,  cup (120 mg).  Peach, 1 each (185 mg).  Peaches, canned,  cup (120 mg).  Pears, canned,  cup (120 mg).  Peas, green, frozen,  cup (90 mg).  Peppers, green,  cup (130 mg).  Peppers, red,  cup (160 mg).  Pineapple juice,  cup (165 mg).  Pineapple, fresh or canned,  cup (100 mg).  Plums, 1 each (105 mg).  Pudding, vanilla,  cup (150 mg).  Raspberries,  cup (90 mg).  Rhubarb,  cup (115 mg).  Rice, wild,  cup (80 mg).  Shrimp, 3 oz (155 mg).  Spinach, raw, 1 cup (170 mg).  Strawberries,  cup (125 mg).  Summer squash  cup (175-200 mg).  Swiss chard, raw, 1 cup (135 mg).  Tangerines, 1 each (140 mg).  Tea, brewed, 6 oz (65 mg).  Turnips,  cup (140 mg).  Watermelon,  cup (85 mg).  Wine, red, table, 5 oz (180 mg).  Wine, white, table, 5 oz (100 mg).  Low in potassium The following foods and beverages have less than 50 mg of potassium per serving.  Bread, white, 1 slice (30 mg).  Carbonated beverages, 12 oz (less than 5 mg).  Cheese, 1 oz (20-30 mg).  Cranberries,  cup (45 mg).  Cranberry juice cocktail,  cup (20 mg).  Fats and oils, 1 Tbsp (less than 5 mg).  Hummus, 1 Tbsp (32 mg).  Nectar:  papaya, mango, or pear,  cup (35 mg).  Rice, white or brown,  cup (50 mg).  Spaghetti or macaroni,  cup cooked (30 mg).  Tortilla, flour or corn, 1 each (50 mg).  Waffle, 4 in., 1 each (50 mg).  Water chestnuts,  cup (40 mg).  This information is not intended to replace advice given to you by your health care provider. Make sure you discuss any questions you have with your health care provider. Document Released: 03/06/2005 Document Revised: 12/29/2015 Document Reviewed: 06/19/2013 Elsevier Interactive Patient Education  [employer].

## 2018-08-07 ENCOUNTER — Other Ambulatory Visit: Payer: Self-pay | Admitting: Cardiology

## 2018-08-07 MED ORDER — TELMISARTAN 40 MG PO TABS: 40.0000 mg | ORAL_TABLET | Freq: Every day | ORAL | 3 refills | Status: DC

## 2018-08-07 MED ORDER — SPIRONOLACTONE 25 MG PO TABS: 25.0000 mg | ORAL_TABLET | Freq: Every day | ORAL | 3 refills | Status: DC

## 2018-08-07 MED ORDER — CARVEDILOL 25 MG PO TABS: 25.0000 mg | ORAL_TABLET | Freq: Two times a day (BID) | ORAL | 1 refills | Status: DC

## 2018-08-07 MED ORDER — CLONIDINE HCL 0.1 MG PO TABS: 0.1000 mg | ORAL_TABLET | Freq: Two times a day (BID) | ORAL | 2 refills | Status: DC

## 2018-08-07 NOTE — Telephone Encounter (Signed)
°*  STAT* If patient is at the pharmacy, call can be transferred to refill team.   1. Which medications need to be refilled? (please list name of each medication and dose if known) Carvediloll 25mg  takes 2 daily   2. Which pharmacy/location (including street and city if local pharmacy) is medication to be sent to? [facility]  3. Do they need a 30 day or 90 day supply? 90   *STAT* If patient is at the pharmacy, call can be transferred to refill team.   1. Which medications need to be refilled? (please list name of each medication and dose if known) telemarsartin 40mg  takes 1 daily   2. Which pharmacy/location (including street and city if local pharmacy) is medication to be sent to? [facility]   3. Do they need a 30 day or 90 day supply? 90   *STAT* If patient is at the pharmacy, call can be transferred to refill team.   1. Which medications need to be refilled? (please list name of each medication and dose if known) Spiranlactone 25mg  takes 1 daily   2. Which pharmacy/location (including street and city if local pharmacy) is medication to be sent to?[facility]  3. Do they need a 30 day or 90 day supply? 90   *STAT* If patient is at the pharmacy, call can be transferred to refill team.   1. Which medications need to be refilled? (please list name of each medication and dose if known) Clonidine CH1 .01mg  takes twice daily  2. Which pharmacy/location (including street and city if local pharmacy) is medication to be sent to?[facility]  3. Do they need a 30 day or 90 day supply? [place]

## 2018-08-12 ENCOUNTER — Telehealth: Payer: Self-pay

## 2018-08-12 NOTE — Telephone Encounter (Signed)
Patient submitted home blood pressure readings for Dr Bettina Gavia to review.  Dr Bettina Gavia reviewed readings and patient was advised that readings look good per Dr Bettina Gavia.  Patient advised to continue current medications and keep follow up appointments as planned.  Patient agreed to plan and verbalized understanding.

## 2018-08-13 ENCOUNTER — Other Ambulatory Visit (HOSPITAL_BASED_OUTPATIENT_CLINIC_OR_DEPARTMENT_OTHER): Payer: Self-pay

## 2018-08-13 ENCOUNTER — Other Ambulatory Visit: Payer: Self-pay

## 2018-08-20 ENCOUNTER — Ambulatory Visit: Payer: Self-pay | Admitting: Cardiology

## 2018-09-01 DIAGNOSIS — I482 Chronic atrial fibrillation, unspecified: Secondary | ICD-10-CM | POA: Diagnosis not present

## 2018-09-01 DIAGNOSIS — E785 Hyperlipidemia, unspecified: Secondary | ICD-10-CM | POA: Diagnosis not present

## 2018-09-01 DIAGNOSIS — Z125 Encounter for screening for malignant neoplasm of prostate: Secondary | ICD-10-CM | POA: Diagnosis not present

## 2018-09-01 DIAGNOSIS — Z1331 Encounter for screening for depression: Secondary | ICD-10-CM | POA: Diagnosis not present

## 2018-09-01 DIAGNOSIS — M109 Gout, unspecified: Secondary | ICD-10-CM | POA: Diagnosis not present

## 2018-09-01 DIAGNOSIS — I129 Hypertensive chronic kidney disease with stage 1 through stage 4 chronic kidney disease, or unspecified chronic kidney disease: Secondary | ICD-10-CM | POA: Diagnosis not present

## 2018-09-18 ENCOUNTER — Other Ambulatory Visit: Payer: Self-pay

## 2018-09-30 DIAGNOSIS — H52223 Regular astigmatism, bilateral: Secondary | ICD-10-CM | POA: Diagnosis not present

## 2018-09-30 DIAGNOSIS — Z0100 Encounter for examination of eyes and vision without abnormal findings: Secondary | ICD-10-CM | POA: Diagnosis not present

## 2018-10-21 ENCOUNTER — Other Ambulatory Visit: Payer: Self-pay

## 2018-10-27 ENCOUNTER — Other Ambulatory Visit: Payer: Self-pay

## 2018-10-27 ENCOUNTER — Encounter: Payer: Self-pay | Admitting: Podiatry

## 2018-10-27 ENCOUNTER — Ambulatory Visit: Payer: Medicare HMO | Admitting: Podiatry

## 2018-10-27 VITALS — Temp 97.8°F | Resp 16

## 2018-10-27 DIAGNOSIS — M79676 Pain in unspecified toe(s): Secondary | ICD-10-CM

## 2018-10-27 DIAGNOSIS — L600 Ingrowing nail: Secondary | ICD-10-CM | POA: Diagnosis not present

## 2018-10-27 MED ORDER — NEOMYCIN-POLYMYXIN-HC 3.5-10000-1 OT SOLN: OTIC | 0 refills | Status: DC

## 2018-10-27 NOTE — Progress Notes (Signed)
Subjective:  Patient ID: [name], male    DOB: 1956-04-23,  MRN: [id number]  Chief Complaint  Patient presents with  . Nail Problem    Lt halux lateral border x 3 wks; numb feeling - no injury Tx: none -pt states he had redness and swelling    63 y.o. male presents with the above complaint.   Review of Systems: Negative except as noted in the HPI. Denies N/V/F/Ch.  Past Medical History:  Diagnosis Date  . Atrial fibrillation ([place]) 02/26/2011  . Chest pain   . CHF (congestive heart failure) ([place])   . Chronic anticoagulation 02/17/2015  . Chronic gout 02/21/2015  . ED (erectile dysfunction)   . Hypertension   . Hypertensive heart and chronic kidney disease with heart failure and stage 1 through stage 4 chronic kidney disease, or chronic kidney disease ([place]) 02/17/2015  . Hypertr obst cardiomyop   . Hypertrophic obstructive cardiomyopathy ([place]) 02/26/2011   Overview:  Last Assessment & Plan:  He is doing very well from a cardiac standpoint. He denies any episodes of chest pain. His systolic murmur is very quiet and I do not think that he has a significant outflow gradient at this time.  . Mitral regurgitation   . MVP (mitral valve prolapse)   . PAF (paroxysmal atrial fibrillation) ([facility])     Current Outpatient Medications:  .  allopurinol (ZYLOPRIM) 300 MG tablet, Take 300 mg by mouth daily. , Disp: , Rfl:  .  aspirin 81 MG tablet, Take 81 mg by mouth daily.  , Disp: , Rfl:  .  calcium carbonate (CALCIUM 600) 600 MG TABS tablet, Take 600 mg by mouth daily., Disp: , Rfl:  .  carvedilol (COREG) 25 MG tablet, Take 1 tablet (25 mg total) by mouth 2 (two) times daily., Disp: 180 tablet, Rfl: 1 .  cloNIDine (CATAPRES) 0.1 MG tablet, Take 1 tablet (0.1 mg total) by mouth 2 (two) times daily., Disp: 180 tablet, Rfl: 2 .  Colchicine 0.6 MG CAPS, Take 0.6 mg by mouth daily as needed., Disp: , Rfl:  .  meclizine (ANTIVERT) 25 MG tablet, Take 1 tablet by mouth daily as needed., Disp: , Rfl:   .  spironolactone (ALDACTONE) 25 MG tablet, Take 1 tablet (25 mg total) by mouth daily., Disp: 90 tablet, Rfl: 3 .  telmisartan (MICARDIS) 40 MG tablet, Take 1 tablet (40 mg total) by mouth daily., Disp: 90 tablet, Rfl: 3 .  neomycin-polymyxin-hydrocortisone (CORTISPORIN) OTIC solution, Apply 2-3 drops to the ingrown toenail site twice daily. Cover with band-aid., Disp: 10 mL, Rfl: 0  Social History   Tobacco Use  Smoking Status Never Smoker  Smokeless Tobacco Never Used    Allergies  Allergen Reactions  . Codeine Swelling  . Dabigatran Diarrhea  . Warfarin Other (See Comments)    Headache  . Xarelto [Rivaroxaban] Diarrhea    ? Diarrhea due to xarelto.     Objective:   Vitals:   10/27/18 0823  Resp: 16  Temp: 97.8 F (36.6 C)   There is no height or weight on file to calculate BMI. Constitutional Well developed. Well nourished.  Vascular Dorsalis pedis pulses palpable bilaterally. Posterior tibial pulses palpable bilaterally. Capillary refill normal to all digits.  No cyanosis or clubbing noted. Pedal hair growth normal.  Neurologic Normal speech. Oriented to person, place, and time. Epicritic sensation to light touch grossly present bilaterally.  Dermatologic Painful ingrowing nail at lateral nail borders of the hallux nail left. No other open wounds.  No skin lesions.  Orthopedic: Normal joint ROM without pain or crepitus bilaterally. No visible deformities. No bony tenderness.   Radiographs: None Assessment:   1. Ingrown nail   2. Pain around toenail    Plan:  Patient was evaluated and treated and all questions answered.  Ingrown Nail, left -Patient elects to proceed with minor surgery to remove ingrown toenail removal today. Consent reviewed and signed by patient. -Ingrown nail excised. See procedure note. -Educated on post-procedure care including soaking. Written instructions provided and reviewed. -Patient to follow up in 2 weeks for nail check.   Procedure: Excision of Ingrown Toenail Location: Left 1st lateral nail borders. Anesthesia: Lidocaine 1% plain; 1.5 mL and Marcaine 0.5% plain; 1.5 mL, digital block. Skin Prep: Betadine. Dressing: Silvadene; telfa; dry, sterile, compression dressing. Technique: Following skin prep, the toe was exsanguinated and a tourniquet was secured at the base of the toe. The affected nail border was freed, split with a nail splitter, and excised. Chemical matrixectomy was then performed with phenol and irrigated out with alcohol. The tourniquet was then removed and sterile dressing applied. Disposition: Patient tolerated procedure well. Patient to return in 2 weeks for follow-up.   Return if symptoms worsen or fail to improve.

## 2018-10-27 NOTE — Patient Instructions (Signed)

## 2018-10-28 ENCOUNTER — Ambulatory Visit: Payer: Self-pay | Admitting: Podiatry

## 2018-12-12 ENCOUNTER — Other Ambulatory Visit: Payer: Self-pay | Admitting: Cardiology

## 2019-01-15 DIAGNOSIS — Z20828 Contact with and (suspected) exposure to other viral communicable diseases: Secondary | ICD-10-CM | POA: Diagnosis not present

## 2019-01-15 DIAGNOSIS — Z03818 Encounter for observation for suspected exposure to other biological agents ruled out: Secondary | ICD-10-CM | POA: Diagnosis not present

## 2019-01-16 DIAGNOSIS — M10062 Idiopathic gout, left knee: Secondary | ICD-10-CM | POA: Diagnosis not present

## 2019-03-03 DIAGNOSIS — I482 Chronic atrial fibrillation, unspecified: Secondary | ICD-10-CM | POA: Diagnosis not present

## 2019-03-03 DIAGNOSIS — R739 Hyperglycemia, unspecified: Secondary | ICD-10-CM | POA: Diagnosis not present

## 2019-03-03 DIAGNOSIS — N182 Chronic kidney disease, stage 2 (mild): Secondary | ICD-10-CM | POA: Diagnosis not present

## 2019-03-03 DIAGNOSIS — M109 Gout, unspecified: Secondary | ICD-10-CM | POA: Diagnosis not present

## 2019-03-03 DIAGNOSIS — I129 Hypertensive chronic kidney disease with stage 1 through stage 4 chronic kidney disease, or unspecified chronic kidney disease: Secondary | ICD-10-CM | POA: Diagnosis not present

## 2019-03-03 DIAGNOSIS — Z79899 Other long term (current) drug therapy: Secondary | ICD-10-CM | POA: Diagnosis not present

## 2019-03-03 DIAGNOSIS — R635 Abnormal weight gain: Secondary | ICD-10-CM | POA: Diagnosis not present

## 2019-03-03 DIAGNOSIS — E785 Hyperlipidemia, unspecified: Secondary | ICD-10-CM | POA: Diagnosis not present

## 2019-03-12 DIAGNOSIS — L989 Disorder of the skin and subcutaneous tissue, unspecified: Secondary | ICD-10-CM | POA: Diagnosis not present

## 2019-03-12 DIAGNOSIS — D485 Neoplasm of uncertain behavior of skin: Secondary | ICD-10-CM | POA: Diagnosis not present

## 2019-03-12 DIAGNOSIS — K5900 Constipation, unspecified: Secondary | ICD-10-CM | POA: Diagnosis not present

## 2019-03-12 DIAGNOSIS — R1310 Dysphagia, unspecified: Secondary | ICD-10-CM | POA: Diagnosis not present

## 2019-03-12 DIAGNOSIS — R12 Heartburn: Secondary | ICD-10-CM | POA: Diagnosis not present

## 2019-03-24 ENCOUNTER — Encounter: Payer: Self-pay | Admitting: Gastroenterology

## 2019-03-24 NOTE — Progress Notes (Signed)
Cardiology Office Note:    Date:  03/25/2019   ID:  [name], DOB 15-May-1956, MRN [id number]  PCP:  Nicoletta Dress, MD  Cardiologist:  Shirlee More, MD    Referring MD: Nicoletta Dress, MD    ASSESSMENT:    1. Hypertensive heart and chronic kidney disease with heart failure and stage 1 through stage 4 chronic kidney disease, or chronic kidney disease ([place])   2. Hypertrophic obstructive cardiomyopathy ([place])   3. Chronic a-fib ([place])   4. Chronic anticoagulation    PLAN:    In order of problems listed above:  1. Stable BP at target follows at home and typically runs 937-902 systolic continue his current medical regimen which includes beta-blocker with his obstructive hypertrophic cardiomyopathy centrally acting clonidine distal diuretic and ARB.  Renal function is stable 2. Asymptomatic he has made a decision not to having a repeat echocardiogram and fortunately no murmur on examination no evidence of obstruction and not having symptoms. 3. Stable rate controlled with beta-blocker he has chosen not to be anticoagulated.   Next appointment: 1 year   Medication Adjustments/Labs and Tests Ordered: Current medicines are reviewed at length with the patient today.  Concerns regarding medicines are outlined above.  No orders of the defined types were placed in this encounter.  No orders of the defined types were placed in this encounter.   Chief Complaint  Patient presents with  . Follow-up    for obstructive HOCM    History of Present Illness:    [name] is a 63 y.o. male with a hx of obstructive hypertrophic cardiomyopathy, heart failure, chronic atrial fibrillation, hypertension and CKD  last seen 07/02/2018.  Compliance with diet, lifestyle and medications: Yes  He is frustrated with his weight unfortunately is not having edema chest pain shortness of breath palpitation or syncope.  He has made a decision not to accept anticoagulation and has had no TIA  and he decided not to have a repeat echocardiogram after last visit and voices the same opinion today.  I asked him to contact me if he is having cardiovascular symptoms particularly syncope or TIA Past Medical History:  Diagnosis Date  . Atrial fibrillation ([place]) 02/26/2011  . Chest pain   . CHF (congestive heart failure) ([place])   . Chronic anticoagulation 02/17/2015  . Chronic gout 02/21/2015  . ED (erectile dysfunction)   . Hypertension   . Hypertensive heart and chronic kidney disease with heart failure and stage 1 through stage 4 chronic kidney disease, or chronic kidney disease ([place]) 02/17/2015  . Hypertr obst cardiomyop   . Hypertrophic obstructive cardiomyopathy ([place]) 02/26/2011   Overview:  Last Assessment & Plan:  He is doing very well from a cardiac standpoint. He denies any episodes of chest pain. His systolic murmur is very quiet and I do not think that he has a significant outflow gradient at this time.  . Mitral regurgitation   . MVP (mitral valve prolapse)   . PAF (paroxysmal atrial fibrillation) ([place])     Past Surgical History:  Procedure Laterality Date  . CARDIOVASCULAR STRESS TEST  09/01/2002   EF 35%  . CHOLECYSTECTOMY    . TONSILLECTOMY    . TRANSESOPHAGEAL ECHOCARDIOGRAM  09/13/2008  . TRANSTHORACIC ECHOCARDIOGRAM  09/10/2008   EF 70-75%    Current Medications: Current Meds  Medication Sig  . allopurinol (ZYLOPRIM) 300 MG tablet Take 300 mg by mouth daily.   Marland Kitchen aspirin 81 MG tablet Take 81 mg  by mouth daily.    . calcium carbonate (CALCIUM 600) 600 MG TABS tablet Take 600 mg by mouth daily.  . carvedilol (COREG) 25 MG tablet TAKE 1 TABLET TWICE DAILY  . cloNIDine (CATAPRES) 0.1 MG tablet Take 1 tablet (0.1 mg total) by mouth 2 (two) times daily.  . Colchicine 0.6 MG CAPS Take 0.6 mg by mouth daily as needed.  . famotidine (PEPCID) 40 MG tablet Take 40 mg by mouth daily.  . meclizine (ANTIVERT) 25 MG tablet Take 1 tablet by mouth daily as needed.  Marland Kitchen spironolactone  (ALDACTONE) 25 MG tablet Take 1 tablet (25 mg total) by mouth daily.  Marland Kitchen telmisartan (MICARDIS) 40 MG tablet Take 1 tablet (40 mg total) by mouth daily.     Allergies:   Codeine, Dabigatran, Warfarin, and Xarelto [rivaroxaban]   Social History   Socioeconomic History  . Marital status: Married    Spouse name: Not on file  . Number of children: Not on file  . Years of education: Not on file  . Highest education level: Not on file  Occupational History  . Not on file  Social Needs  . Financial resource strain: Not on file  . Food insecurity    Worry: Not on file    Inability: Not on file  . Transportation needs    Medical: Not on file    Non-medical: Not on file  Tobacco Use  . Smoking status: Never Smoker  . Smokeless tobacco: Never Used  Substance and Sexual Activity  . Alcohol use: No  . Drug use: No  . Sexual activity: Not on file  Lifestyle  . Physical activity    Days per week: Not on file    Minutes per session: Not on file  . Stress: Not on file  Relationships  . Social Herbalist on phone: Not on file    Gets together: Not on file    Attends religious service: Not on file    Active member of club or organization: Not on file    Attends meetings of clubs or organizations: Not on file    Relationship status: Not on file  Other Topics Concern  . Not on file  Social History Narrative  . Not on file     Family History: The patient's family history includes Alcohol abuse in his father; COPD in his mother. ROS:   Please see the history of present illness.    All other systems reviewed and are negative.  EKGs/Labs/Other Studies Reviewed:    The following studies were reviewed today:  EKG:  EKG ordered today and personally reviewed.  The ekg ordered today demonstrates atrial fibrillation controlled ventricular rate  Recent Labs: 07/02/2018: BUN 17; Creatinine, Ser 1.30; Potassium 4.7; Sodium 138  03/03/2019 creatinine 1.24 TSH mildly elevated at  5.1 A1c 5.6 09/01/2018 cholesterol 140 HDL 29 LDL 75  Physical Exam:    VS:  BP 110/80 (BP Location: Right Arm, Patient Position: Sitting, Cuff Size: Large)   Pulse 67   Ht 6' (1.829 m)   Wt (!) 307 lb 12.8 oz (139.6 kg)   SpO2 97%   BMI 41.75 kg/m     Wt Readings from Last 3 Encounters:  03/25/19 (!) 307 lb 12.8 oz (139.6 kg)  07/02/18 300 lb (136.1 kg)  06/12/18 (!) 302 lb 6.4 oz (137.2 kg)     GEN:  Well nourished, well developed in no acute distress HEENT: Normal NECK: No JVD; No carotid bruits LYMPHATICS:  No lymphadenopathy CARDIAC: Irregular rhythm no murmur RRR, no murmurs, rubs, gallops RESPIRATORY:  Clear to auscultation without rales, wheezing or rhonchi  ABDOMEN: Soft, non-tender, non-distended MUSCULOSKELETAL: +1 ankle bilateral edema edema; No deformity  SKIN: Warm and dry NEUROLOGIC:  Alert and oriented x 3 PSYCHIATRIC:  Normal affect    Signed, Shirlee More, MD  03/25/2019 9:10 AM    [place]

## 2019-03-25 ENCOUNTER — Encounter: Payer: Self-pay | Admitting: Cardiology

## 2019-03-25 ENCOUNTER — Ambulatory Visit (INDEPENDENT_AMBULATORY_CARE_PROVIDER_SITE_OTHER): Payer: Medicare HMO | Admitting: Cardiology

## 2019-03-25 ENCOUNTER — Other Ambulatory Visit: Payer: Self-pay

## 2019-03-25 VITALS — BP 110/80 | HR 67 | Ht 72.0 in | Wt 307.8 lb

## 2019-03-25 DIAGNOSIS — I130 Hypertensive heart and chronic kidney disease with heart failure and stage 1 through stage 4 chronic kidney disease, or unspecified chronic kidney disease: Secondary | ICD-10-CM | POA: Diagnosis not present

## 2019-03-25 DIAGNOSIS — I421 Obstructive hypertrophic cardiomyopathy: Secondary | ICD-10-CM | POA: Diagnosis not present

## 2019-03-25 DIAGNOSIS — I482 Chronic atrial fibrillation, unspecified: Secondary | ICD-10-CM | POA: Diagnosis not present

## 2019-03-25 NOTE — Patient Instructions (Signed)
Medication Instructions:  Your physician recommends that you continue on your current medications as directed. Please refer to the Current Medication list given to you today.  If you need a refill on your cardiac medications before your next appointment, please call your pharmacy.   Lab work: None  If you have labs (blood work) drawn today and your tests are completely normal, you will receive your results only by: Marland Kitchen MyChart Message (if you have MyChart) OR . A paper copy in the mail If you have any lab test that is abnormal or we need to change your treatment, we will call you to review the results.  Testing/Procedures: You had an EKG today.   Follow-Up: At [facility], you and your health needs are our priority.  As part of our continuing mission to provide you with exceptional heart care, we have created designated Provider Care Teams.  These Care Teams include your primary Cardiologist (physician) and Advanced Practice Providers (APPs -  Physician Assistants and Nurse Practitioners) who all work together to provide you with the care you need, when you need it. You will need a follow up appointment in 1 years.  Please call our office 2 months in advance to schedule this appointment.

## 2019-03-26 ENCOUNTER — Encounter: Payer: Self-pay | Admitting: Gastroenterology

## 2019-03-27 DIAGNOSIS — L570 Actinic keratosis: Secondary | ICD-10-CM | POA: Diagnosis not present

## 2019-03-27 DIAGNOSIS — Z85828 Personal history of other malignant neoplasm of skin: Secondary | ICD-10-CM | POA: Diagnosis not present

## 2019-03-27 DIAGNOSIS — L821 Other seborrheic keratosis: Secondary | ICD-10-CM | POA: Diagnosis not present

## 2019-03-27 DIAGNOSIS — C44622 Squamous cell carcinoma of skin of right upper limb, including shoulder: Secondary | ICD-10-CM | POA: Diagnosis not present

## 2019-04-01 ENCOUNTER — Other Ambulatory Visit: Payer: Self-pay | Admitting: Cardiology

## 2019-04-03 DIAGNOSIS — R7989 Other specified abnormal findings of blood chemistry: Secondary | ICD-10-CM | POA: Diagnosis not present

## 2019-04-08 DIAGNOSIS — D485 Neoplasm of uncertain behavior of skin: Secondary | ICD-10-CM | POA: Diagnosis not present

## 2019-04-08 DIAGNOSIS — L570 Actinic keratosis: Secondary | ICD-10-CM | POA: Diagnosis not present

## 2019-04-08 DIAGNOSIS — L821 Other seborrheic keratosis: Secondary | ICD-10-CM | POA: Diagnosis not present

## 2019-04-08 DIAGNOSIS — Z85828 Personal history of other malignant neoplasm of skin: Secondary | ICD-10-CM | POA: Diagnosis not present

## 2019-04-08 DIAGNOSIS — D2239 Melanocytic nevi of other parts of face: Secondary | ICD-10-CM | POA: Diagnosis not present

## 2019-04-08 DIAGNOSIS — D1801 Hemangioma of skin and subcutaneous tissue: Secondary | ICD-10-CM | POA: Diagnosis not present

## 2019-04-08 DIAGNOSIS — D2372 Other benign neoplasm of skin of left lower limb, including hip: Secondary | ICD-10-CM | POA: Diagnosis not present

## 2019-04-08 DIAGNOSIS — D225 Melanocytic nevi of trunk: Secondary | ICD-10-CM | POA: Diagnosis not present

## 2019-04-09 ENCOUNTER — Ambulatory Visit: Payer: Medicare HMO | Admitting: Gastroenterology

## 2019-04-09 ENCOUNTER — Telehealth: Payer: Self-pay

## 2019-04-09 ENCOUNTER — Other Ambulatory Visit: Payer: Self-pay

## 2019-04-09 ENCOUNTER — Encounter: Payer: Self-pay | Admitting: Gastroenterology

## 2019-04-09 VITALS — BP 108/70 | HR 63 | Temp 97.8°F | Ht 72.0 in | Wt 304.4 lb

## 2019-04-09 DIAGNOSIS — K219 Gastro-esophageal reflux disease without esophagitis: Secondary | ICD-10-CM | POA: Diagnosis not present

## 2019-04-09 DIAGNOSIS — R1319 Other dysphagia: Secondary | ICD-10-CM

## 2019-04-09 DIAGNOSIS — R1310 Dysphagia, unspecified: Secondary | ICD-10-CM

## 2019-04-09 DIAGNOSIS — Z800 Family history of malignant neoplasm of digestive organs: Secondary | ICD-10-CM

## 2019-04-09 MED ORDER — PANTOPRAZOLE SODIUM 40 MG PO TBEC: 40.0000 mg | DELAYED_RELEASE_TABLET | Freq: Every day | ORAL | 3 refills | Status: DC

## 2019-04-09 NOTE — Patient Instructions (Signed)
If you are age 65 or older, your body mass index should be between 23-30. Your Body mass index is 41.28 kg/m. If this is out of the aforementioned range listed, please consider follow up with your Primary Care Provider.  If you are age 30 or younger, your body mass index should be between 19-25. Your Body mass index is 41.28 kg/m. If this is out of the aformentioned range listed, please consider follow up with your Primary Care Provider.   To help prevent the possible spread of infection to our patients, communities, and staff; we will be implementing the following measures:  As of now we are not allowing any visitors/family members to accompany you to any upcoming appointments with [facility]. If you have any concerns about this please contact our office to discuss prior to the appointment.   We have sent the following medications to your pharmacy for you to pick up at your convenience: Protonix 40mg  30 minutes before breakfast everyday.  Be sure to chew your foods especially meat and bread well, and eat slowly.  You have been scheduled for a Barium Esophogram at [facility] (1st floor of the hospital) on 04/15/2019 at 9:30am. Please arrive 15 minutes prior to your appointment for registration. Make certain not to have anything to eat or drink 3 hours prior to your test. If you need to reschedule for any reason, please contact radiology at [phone] to do so. __________________________________________________________________ A barium swallow is an examination that concentrates on views of the esophagus. This tends to be a double contrast exam (barium and two liquids which, when combined, create a gas to distend the wall of the oesophagus) or single contrast (non-ionic iodine based). The study is usually tailored to your symptoms so a good history is essential. Attention is paid during the study to the form, structure and configuration of the esophagus, looking for functional  disorders (such as aspiration, dysphagia, achalasia, motility and reflux) EXAMINATION You may be asked to change into a gown, depending on the type of swallow being performed. A radiologist and radiographer will perform the procedure. The radiologist will advise you of the type of contrast selected for your procedure and direct you during the exam. You will be asked to stand, sit or lie in several different positions and to hold a small amount of fluid in your mouth before being asked to swallow while the imaging is performed .In some instances you may be asked to swallow barium coated marshmallows to assess the motility of a solid food bolus. The exam can be recorded as a digital or video fluoroscopy procedure. POST PROCEDURE It will take 1-2 days for the barium to pass through your system. To facilitate this, it is important, unless otherwise directed, to increase your fluids for the next 24-48hrs and to resume your normal diet.  This test typically takes about 30 minutes to perform. __________________________________________________________________________________   We will contact you in the next month or two to schedule your Colonoscopy at [facility].  Thank you,  Dr. Jackquline Denmark

## 2019-04-09 NOTE — Telephone Encounter (Signed)
[facility] Pre-operative Risk Assessment     Request for surgical clearance:     Endoscopy Procedure  What type of surgery is being performed?     EGD with Dil  When is this surgery scheduled?     Not yet scheduled waiting for Cardiac Clearancew  What type of clearance is required ?   Pharmacy  Are there any medications that need to be held prior to surgery and how long? No  Practice name and name of physician performing surgery?      [facility] [place]  What is your office phone and fax number?      Phone- [phone]  [phone]  Anesthesia type (None, local, MAC, general) ?       MAC

## 2019-04-09 NOTE — Progress Notes (Signed)
Chief Complaint:   Referring Provider:  Nicoletta Dress, MD      ASSESSMENT AND PLAN;   #1. GERD with esophageal dysphagia. D/d includes eso stricture, Schatzki's ring, motility disorder, eosinophilic esophagitis, pill induced esophagitis, r/o esophageal carcinoma or extrinsic lesions. #2. H/O colonic polyps.   #3. FH of colon cancer (mom at age 65)  Plan: -Protonix 40 mg p.o. QD, 1/2 hr before breakfast. #30.  11 refills. -Barium swallow with barium tablet. -EGD with eso bx/possibly dil and colon with 2 day prep at [facility].  Need cardiac cleaence before.  Can continue aspirin throughout. -I have instructed patient to chew foods especially meats and breads well and eat slowly.      HPI:    [name] is a 63 y.o. male  Dysphagia to mostly solids x 1 year, getting worse over the last 1 month.  Has been having problems swallowing pills especially potassium.  He cuts it in half.  Has occasional heartburn.  No odynophagia or regurgitation.  Advised GI work-up.  No melena or hematochezia.  No weight loss.  In fact he has gained weight.  He used to weigh 276 pounds October 2013 and now weighs 304 pounds.  Denies having any significant diarrhea or constipation.  He is also due for repeat colonoscopy.  He was supposed to get it done in 2016 but did not.  Being followed closely by Dr. Bettina Gavia due to history of A. fib and CHF.  I have reviewed Dr. Joya Gaskins last note.  He could not tolerate any anticoagulation and had side effects with Pradaxa/Xarelto/Eliquis.. Maintained on aspirin.  Past GI procedures: -Colon 07/2012 ([facility])-significant colonic polyps status post polypectomy, moderate sigmoid diverticulosis. Bx- TA.  Told to repeat in 3 years (2016).  Got letters but did not come until today. Past Medical History:  Diagnosis Date  . Atrial fibrillation ([place]) 02/26/2011  . Chest pain   . CHF (congestive heart failure) ([place])   . Chronic anticoagulation 02/17/2015  . Chronic gout  02/21/2015  . ED (erectile dysfunction)   . Hypertension   . Hypertensive heart and chronic kidney disease with heart failure and stage 1 through stage 4 chronic kidney disease, or chronic kidney disease ([place]) 02/17/2015  . Hypertr obst cardiomyop   . Hypertrophic obstructive cardiomyopathy ([place]) 02/26/2011   Overview:  Last Assessment & Plan:  He is doing very well from a cardiac standpoint. He denies any episodes of chest pain. His systolic murmur is very quiet and I do not think that he has a significant outflow gradient at this time.  . Mitral regurgitation   . MVP (mitral valve prolapse)   . PAF (paroxysmal atrial fibrillation) ([place])     Past Surgical History:  Procedure Laterality Date  . CARDIOVASCULAR STRESS TEST  09/01/2002   EF 35%  . CHOLECYSTECTOMY    . COLONOSCOPY  07/10/2012   Colonic polyps, status post polypectomy. Moderate predominantly sigmoid diverticulosis. Small internal hermorrhoids.   . INGUINAL HERNIA REPAIR     age 63  . TONSILLECTOMY    . TRANSESOPHAGEAL ECHOCARDIOGRAM  09/13/2008  . TRANSTHORACIC ECHOCARDIOGRAM  09/10/2008   EF 70-75%    Family History  Problem Relation Age of Onset  . COPD Mother   . Colon cancer Mother   . Alcohol abuse Father   . Esophageal cancer Neg Hx     Social History   Tobacco Use  . Smoking status: Never Smoker  . Smokeless tobacco: Never Used  Substance Use  Topics  . Alcohol use: No  . Drug use: No    Current Outpatient Medications  Medication Sig Dispense Refill  . allopurinol (ZYLOPRIM) 300 MG tablet Take 300 mg by mouth daily.     Marland Kitchen aspirin 81 MG tablet Take 81 mg by mouth daily.      . calcium carbonate (CALCIUM 600) 600 MG TABS tablet Take 600 mg by mouth daily.    . carvedilol (COREG) 25 MG tablet TAKE 1 TABLET TWICE DAILY 180 tablet 1  . cloNIDine (CATAPRES) 0.1 MG tablet TAKE 1 TABLET TWICE DAILY 180 tablet 1  . Colchicine 0.6 MG CAPS Take 0.6 mg by mouth daily as needed.    . famotidine (PEPCID) 40 MG tablet  Take 40 mg by mouth daily.    Marland Kitchen levothyroxine (SYNTHROID) 25 MCG tablet Take 25 mcg by mouth daily before breakfast.    . meclizine (ANTIVERT) 25 MG tablet Take 1 tablet by mouth daily as needed.    Marland Kitchen spironolactone (ALDACTONE) 25 MG tablet TAKE 1 TABLET EVERY DAY 90 tablet 1  . telmisartan (MICARDIS) 40 MG tablet TAKE 1 TABLET (40 MG TOTAL) BY MOUTH DAILY. 90 tablet 1   No current facility-administered medications for this visit.     Allergies  Allergen Reactions  . Codeine Swelling  . Dabigatran Diarrhea  . Warfarin Other (See Comments)    Headache  . Xarelto [Rivaroxaban] Diarrhea    ? Diarrhea due to xarelto.      Review of Systems:  Constitutional: Denies fever, chills, diaphoresis, appetite change and fatigue.  HEENT: Denies photophobia, eye pain, redness, hearing loss, ear pain, congestion, sore throat, rhinorrhea, sneezing, mouth sores, neck pain, neck stiffness and tinnitus.   Respiratory: Denies SOB, DOE, cough, chest tightness,  and wheezing.   Cardiovascular: Denies chest pain, palpitations and leg swelling.  Genitourinary: Denies dysuria, urgency, frequency, hematuria, flank pain and difficulty urinating.  Musculoskeletal: Denies myalgias, back pain, joint swelling, arthralgias and gait problem.  Skin: No rash.  Neurological: Denies dizziness, seizures, syncope, weakness, light-headedness, numbness and headaches.  Hematological: Denies adenopathy. Easy bruising, personal or family bleeding history  Psychiatric/Behavioral: No anxiety or depression     Physical Exam:    BP 108/70   Pulse 63   Temp 97.8 F (36.6 C)   Ht 6' (1.829 m)   Wt (!) 304 lb 6 oz (138.1 kg)   BMI 41.28 kg/m  Filed Weights   04/09/19 1044  Weight: (!) 304 lb 6 oz (138.1 kg)   Constitutional:  Well-developed, in no acute distress. Psychiatric: Normal mood and affect. Behavior is normal. HEENT: Pupils normal.  Conjunctivae are normal. No scleral icterus. Neck supple.  Cardiovascular:  Normal rate, regular rhythm. No edema Pulmonary/chest: Effort normal and breath sounds normal. No wheezing, rales or rhonchi. Abdominal: Soft, nondistended. Nontender. Bowel sounds active throughout. There are no masses palpable. No hepatomegaly. Rectal:  defered Neurological: Alert and oriented to person place and time. Skin: Skin is warm and dry. No rashes noted.  Data Reviewed: I have personally reviewed following labs and imaging studies  CBC: CBC Latest Ref Rng & Units 09/10/2008 09/09/2008 09/09/2008  WBC 4.0 - 10.5 K/uL 10.4 12.3(H) 15.7(H)  Hemoglobin 13.0 - 17.0 g/dL 14.7 16.3 16.8  Hematocrit 39.0 - 52.0 % 43.2 46.5 48.9  Platelets 150 - 400 K/uL 198 222 250    CMP: CMP Latest Ref Rng & Units 07/02/2018 06/12/2018 09/10/2008  Glucose 65 - 99 mg/dL 98 103(H) 95  BUN 8 -  27 mg/dL 17 16 15   Creatinine 0.76 - 1.27 mg/dL 1.30(H) 1.28(H) 1.16  Sodium 134 - 144 mmol/L 138 144 136  Potassium 3.5 - 5.2 mmol/L 4.7 5.0 4.0  Chloride 96 - 106 mmol/L 100 103 103  CO2 20 - 29 mmol/L 22 25 26   Calcium 8.6 - 10.2 mg/dL 9.6 9.6 8.1(L)  Total Protein 6.0 - 8.3 g/dL - - 6.1  Total Bilirubin 0.3 - 1.2 mg/dL - - 1.4(H)  Alkaline Phos 39 - 117 U/L - - 85  AST 0 - 37 U/L - - 26  ALT 0 - 53 U/L - - 74(H)     Carmell Austria, MD 04/09/2019, 10:59 AM  Cc: Nicoletta Dress, MD

## 2019-04-14 NOTE — Telephone Encounter (Signed)
   Primary Cardiologist: Shirlee More, MD  Chart reviewed as part of pre-operative protocol coverage. Patient was contacted 04/14/2019 in reference to pre-operative risk assessment for pending surgery as outlined below.  [name] was last seen on 03/25/2019 by Dr. Bettina Gavia.  Since that day, [name] has done well without chest pain or shortness of breath.  Therefore, based on ACC/AHA guidelines, the patient would be at acceptable risk for the planned procedure without further cardiovascular testing.   I will route this recommendation to the requesting party via Epic fax function and remove from pre-op pool.  Please call with questions. Given his history of hypertrophic cardiomyopathy, we recommend IV hydration for any hypotension encountered during the procedure.   [place], [place] 04/14/2019, 3:24 PM

## 2019-04-15 ENCOUNTER — Ambulatory Visit (HOSPITAL_COMMUNITY)
Admission: RE | Admit: 2019-04-15 | Discharge: 2019-04-15 | Disposition: A | Discharge status: Home / Self Care | Payer: Medicare HMO | Source: Ambulatory Visit | Attending: Gastroenterology | Admitting: Gastroenterology

## 2019-04-15 ENCOUNTER — Other Ambulatory Visit: Payer: Self-pay

## 2019-04-15 DIAGNOSIS — K224 Dyskinesia of esophagus: Secondary | ICD-10-CM | POA: Diagnosis not present

## 2019-04-15 DIAGNOSIS — Z800 Family history of malignant neoplasm of digestive organs: Secondary | ICD-10-CM | POA: Diagnosis not present

## 2019-04-15 DIAGNOSIS — R1310 Dysphagia, unspecified: Secondary | ICD-10-CM | POA: Diagnosis not present

## 2019-04-15 DIAGNOSIS — K219 Gastro-esophageal reflux disease without esophagitis: Secondary | ICD-10-CM | POA: Insufficient documentation

## 2019-04-15 DIAGNOSIS — R1319 Other dysphagia: Secondary | ICD-10-CM

## 2019-04-24 ENCOUNTER — Telehealth: Payer: Self-pay | Admitting: Gastroenterology

## 2019-04-24 MED ORDER — RABEPRAZOLE SODIUM 20 MG PO TBEC: 20.0000 mg | DELAYED_RELEASE_TABLET | Freq: Every day | ORAL | 6 refills | Status: DC

## 2019-04-24 NOTE — Telephone Encounter (Signed)
Pt reported that he had side effects: diarrhea, stomach pain when talking pantoprazole. FYI.

## 2019-04-24 NOTE — Telephone Encounter (Signed)
I have sent prescription to patients pharmacy, left message for patient.

## 2019-04-24 NOTE — Telephone Encounter (Signed)
No problems Lets change it to AcipHex 20 mg p.o. once a day, 30, 6 refills Thx RG

## 2019-04-24 NOTE — Telephone Encounter (Signed)
Please advise if you would like to make any changes.

## 2019-05-12 DIAGNOSIS — Z23 Encounter for immunization: Secondary | ICD-10-CM | POA: Diagnosis not present

## 2019-05-12 DIAGNOSIS — E039 Hypothyroidism, unspecified: Secondary | ICD-10-CM | POA: Diagnosis not present

## 2019-08-05 ENCOUNTER — Other Ambulatory Visit: Payer: Self-pay | Admitting: Cardiology

## 2019-08-12 DIAGNOSIS — Z1211 Encounter for screening for malignant neoplasm of colon: Secondary | ICD-10-CM | POA: Diagnosis not present

## 2019-08-12 DIAGNOSIS — Z6841 Body Mass Index (BMI) 40.0 and over, adult: Secondary | ICD-10-CM | POA: Diagnosis not present

## 2019-08-12 DIAGNOSIS — Z125 Encounter for screening for malignant neoplasm of prostate: Secondary | ICD-10-CM | POA: Diagnosis not present

## 2019-08-12 DIAGNOSIS — Z136 Encounter for screening for cardiovascular disorders: Secondary | ICD-10-CM | POA: Diagnosis not present

## 2019-08-12 DIAGNOSIS — Z0000 Encounter for general adult medical examination without abnormal findings: Secondary | ICD-10-CM | POA: Diagnosis not present

## 2019-08-12 DIAGNOSIS — E785 Hyperlipidemia, unspecified: Secondary | ICD-10-CM | POA: Diagnosis not present

## 2019-08-12 DIAGNOSIS — Z1331 Encounter for screening for depression: Secondary | ICD-10-CM | POA: Diagnosis not present

## 2019-08-12 DIAGNOSIS — Z9181 History of falling: Secondary | ICD-10-CM | POA: Diagnosis not present

## 2019-09-08 DIAGNOSIS — E039 Hypothyroidism, unspecified: Secondary | ICD-10-CM | POA: Diagnosis not present

## 2019-09-08 DIAGNOSIS — I129 Hypertensive chronic kidney disease with stage 1 through stage 4 chronic kidney disease, or unspecified chronic kidney disease: Secondary | ICD-10-CM | POA: Diagnosis not present

## 2019-09-08 DIAGNOSIS — E785 Hyperlipidemia, unspecified: Secondary | ICD-10-CM | POA: Diagnosis not present

## 2019-09-08 DIAGNOSIS — Z1331 Encounter for screening for depression: Secondary | ICD-10-CM | POA: Diagnosis not present

## 2019-09-08 DIAGNOSIS — Z125 Encounter for screening for malignant neoplasm of prostate: Secondary | ICD-10-CM | POA: Diagnosis not present

## 2019-09-08 DIAGNOSIS — N182 Chronic kidney disease, stage 2 (mild): Secondary | ICD-10-CM | POA: Diagnosis not present

## 2019-09-08 DIAGNOSIS — I482 Chronic atrial fibrillation, unspecified: Secondary | ICD-10-CM | POA: Diagnosis not present

## 2019-09-08 DIAGNOSIS — M109 Gout, unspecified: Secondary | ICD-10-CM | POA: Diagnosis not present

## 2019-09-23 ENCOUNTER — Other Ambulatory Visit: Payer: Self-pay | Admitting: Cardiology

## 2019-10-27 ENCOUNTER — Telehealth: Payer: Self-pay

## 2019-10-27 NOTE — Telephone Encounter (Signed)
Thanks for letting me know He must be feeling better RG

## 2019-10-27 NOTE — Telephone Encounter (Signed)
I have called patient and tried to schedule him at [facility] long for his EGD and Colonoscopy. Patient states that he does not want to do it at this time. Patient advised to call the office when he is ready to schedule.

## 2019-10-30 ENCOUNTER — Ambulatory Visit: Payer: Medicare HMO | Admitting: Cardiology

## 2019-10-30 ENCOUNTER — Other Ambulatory Visit: Payer: Self-pay

## 2019-10-30 VITALS — BP 117/76 | HR 70 | Ht 72.0 in | Wt 306.0 lb

## 2019-10-30 DIAGNOSIS — I130 Hypertensive heart and chronic kidney disease with heart failure and stage 1 through stage 4 chronic kidney disease, or unspecified chronic kidney disease: Secondary | ICD-10-CM

## 2019-10-30 DIAGNOSIS — I421 Obstructive hypertrophic cardiomyopathy: Secondary | ICD-10-CM

## 2019-10-30 DIAGNOSIS — I482 Chronic atrial fibrillation, unspecified: Secondary | ICD-10-CM | POA: Diagnosis not present

## 2019-10-30 MED ORDER — TADALAFIL 5 MG PO TABS: ORAL_TABLET | ORAL | 0 refills | Status: DC

## 2019-10-30 NOTE — Patient Instructions (Signed)
Medication Instructions:  Your physician has recommended you make the following change in your medication:  START Cialis 5 mg take one tablet by mouth daily as needed. *If you need a refill on your cardiac medications before your next appointment, please call your pharmacy*   Lab Work: None If you have labs (blood work) drawn today and your tests are completely normal, you will receive your results only by: Marland Kitchen MyChart Message (if you have MyChart) OR . A paper copy in the mail If you have any lab test that is abnormal or we need to change your treatment, we will call you to review the results.   Testing/Procedures: None   Follow-Up: At [facility], you and your health needs are our priority.  As part of our continuing mission to provide you with exceptional heart care, we have created designated Provider Care Teams.  These Care Teams include your primary Cardiologist (physician) and Advanced Practice Providers (APPs -  Physician Assistants and Nurse Practitioners) who all work together to provide you with the care you need, when you need it.  We recommend signing up for the patient portal called "MyChart".  Sign up information is provided on this After Visit Summary.  MyChart is used to connect with patients for Virtual Visits (Telemedicine).  Patients are able to view lab/test results, encounter notes, upcoming appointments, etc.  Non-urgent messages can be sent to your provider as well.   To learn more about what you can do with MyChart, go to [email or web address].    Your next appointment:   1 year(s)  The format for your next appointment:   In Person  Provider:   Shirlee More, MD   Other Instructions

## 2019-10-30 NOTE — Progress Notes (Signed)
Cardiology Office Note:    Date:  10/30/2019   ID:  [name], DOB 1956-04-09, MRN [id number]  PCP:  Nicoletta Dress, MD  Cardiologist:  Shirlee More, MD    Referring MD: Nicoletta Dress, MD    ASSESSMENT:    1. Hypertrophic obstructive cardiomyopathy ([place])   2. Chronic a-fib ([place])   3. Hypertensive heart and chronic kidney disease with heart failure and stage 1 through stage 4 chronic kidney disease, or chronic kidney disease ([place])    PLAN:    In order of problems listed above:  1. Asymptomatic continue current treatment including beta-blocker and antihypertensive agents.  He has declined echocardiogram and EKG last visit I will plan to see in the office in 1 year unless he has recurrent symptoms 2. Rate controlled continue beta-blocker he is on aspirin and has declined anticoagulation 3. Stable hypertension BP at target home blood pressures consistently less than 120 130/70-80 and will continue multidrug regimen including distal diuretic ARB beta-blocker and clonidine with very good effective durable blood pressure control. 4. Problem erectile dysfunction will try Cialis.   Next appointment: 1 year   Medication Adjustments/Labs and Tests Ordered: Current medicines are reviewed at length with the patient today.  Concerns regarding medicines are outlined above.  No orders of the defined types were placed in this encounter.  No orders of the defined types were placed in this encounter.   Chief Complaint  Patient presents with  . Follow-up    For obstructive hypertrophic cardiomyopathy  . Atrial Fibrillation  . Hypertension  . Chronic Kidney Disease    History of Present Illness:    [name] is a 64 y.o. male with a hx of obstructive hypertrophic cardiomyopathy, heart failure, chronic atrial fibrillation, hypertension and CKD    last seen 03/25/2019. Compliance with diet, lifestyle and medications: Yes  Recently found to be hypothyroid and was on  low-dose replacement.  Labs are done the last few weeks and his PCP office and have them sent in weight lisinopril 40 mg.  He has decided not to be anticoagulated and he has declined having echocardiogram done for follow-up.  He is having no symptoms from his cardiomyopathy no syncope shortness of breath chest pain or palpitation is pleased with the quality of his life except for weight gain.  He has erectile dysfunction after discussion of options and benefits we will try low-dose Cialis. Past Medical History:  Diagnosis Date  . Atrial fibrillation ([place]) 02/26/2011  . Chest pain   . CHF (congestive heart failure) ([place])   . Chronic anticoagulation 02/17/2015  . Chronic gout 02/21/2015  . ED (erectile dysfunction)   . Hypertension   . Hypertensive heart and chronic kidney disease with heart failure and stage 1 through stage 4 chronic kidney disease, or chronic kidney disease ([place]) 02/17/2015  . Hypertr obst cardiomyop   . Hypertrophic obstructive cardiomyopathy ([place]) 02/26/2011   Overview:  Last Assessment & Plan:  He is doing very well from a cardiac standpoint. He denies any episodes of chest pain. His systolic murmur is very quiet and I do not think that he has a significant outflow gradient at this time.  . Mitral regurgitation   . MVP (mitral valve prolapse)   . PAF (paroxysmal atrial fibrillation) ([place])     Past Surgical History:  Procedure Laterality Date  . CARDIOVASCULAR STRESS TEST  09/01/2002   EF 35%  . CHOLECYSTECTOMY    . COLONOSCOPY  07/10/2012   Colonic polyps,  status post polypectomy. Moderate predominantly sigmoid diverticulosis. Small internal hermorrhoids.   . INGUINAL HERNIA REPAIR     age 70  . TONSILLECTOMY    . TRANSESOPHAGEAL ECHOCARDIOGRAM  09/13/2008  . TRANSTHORACIC ECHOCARDIOGRAM  09/10/2008   EF 70-75%    Current Medications: Current Meds  Medication Sig  . allopurinol (ZYLOPRIM) 300 MG tablet Take 300 mg by mouth daily.   Marland Kitchen aspirin 81 MG tablet Take 81 mg by  mouth daily.    . calcium carbonate (CALCIUM 600) 600 MG TABS tablet Take 600 mg by mouth daily.  . carvedilol (COREG) 25 MG tablet TAKE 1 TABLET TWICE DAILY  . cloNIDine (CATAPRES) 0.1 MG tablet TAKE 1 TABLET TWICE DAILY  . Colchicine 0.6 MG CAPS Take 0.6 mg by mouth daily as needed.  . famotidine (PEPCID) 40 MG tablet Take 40 mg by mouth daily.  Marland Kitchen levothyroxine (SYNTHROID) 25 MCG tablet Take 25 mcg by mouth daily before breakfast.  . meclizine (ANTIVERT) 25 MG tablet Take 1 tablet by mouth daily as needed.  . pantoprazole (PROTONIX) 40 MG tablet Take 1 tablet (40 mg total) by mouth daily. Take 30 minutes before breakfast.  . spironolactone (ALDACTONE) 25 MG tablet TAKE 1 TABLET EVERY DAY  . telmisartan (MICARDIS) 40 MG tablet TAKE 1 TABLET (40 MG TOTAL) BY MOUTH DAILY.     Allergies:   Codeine, Dabigatran, Warfarin, and Xarelto [rivaroxaban]   Social History   Socioeconomic History  . Marital status: Married    Spouse name: Not on file  . Number of children: 4  . Years of education: Not on file  . Highest education level: Not on file  Occupational History  . Not on file  Tobacco Use  . Smoking status: Never Smoker  . Smokeless tobacco: Never Used  Substance and Sexual Activity  . Alcohol use: No  . Drug use: No  . Sexual activity: Not on file  Other Topics Concern  . Not on file  Social History Narrative  . Not on file   Social Determinants of Health   Financial Resource Strain:   . Difficulty of Paying Living Expenses:   Food Insecurity:   . Worried About Charity fundraiser in the Last Year:   . Arboriculturist in the Last Year:   Transportation Needs:   . Film/video editor (Medical):   Marland Kitchen Lack of Transportation (Non-Medical):   Physical Activity:   . Days of Exercise per Week:   . Minutes of Exercise per Session:   Stress:   . Feeling of Stress :   Social Connections:   . Frequency of Communication with Friends and Family:   . Frequency of Social  Gatherings with Friends and Family:   . Attends Religious Services:   . Active Member of Clubs or Organizations:   . Attends Archivist Meetings:   Marland Kitchen Marital Status:      Family History: The patient's family history includes Alcohol abuse in his father; COPD in his mother; Colon cancer in his mother. There is no history of Esophageal cancer. ROS:   Please see the history of present illness.    All other systems reviewed and are negative.  EKGs/Labs/Other Studies Reviewed:    The following studies were reviewed today:    Recent Labs: 09/08/2019: Cholesterol 146 HDL 31 LDL 87 TSH 3.98 creatinine top normal 1.20 both A1c and lipids are at target  Physical Exam:    VS:  BP 117/76   Pulse  70   Ht 6' (1.829 m)   Wt (!) 306 lb (138.8 kg)   SpO2 97%   BMI 41.50 kg/m     Wt Readings from Last 3 Encounters:  10/30/19 (!) 306 lb (138.8 kg)  04/09/19 (!) 304 lb 6 oz (138.1 kg)  03/25/19 (!) 307 lb 12.8 oz (139.6 kg)     GEN: Obese well nourished, well developed in no acute distress HEENT: Normal NECK: No JVD; No carotid bruits LYMPHATICS: No lymphadenopathy CARDIAC: Irregular S1 variable  no murmurs, rubs, gallops RESPIRATORY:  Clear to auscultation without rales, wheezing or rhonchi  ABDOMEN: Soft, non-tender, non-distended MUSCULOSKELETAL:  No edema; No deformity  SKIN: Warm and dry NEUROLOGIC:  Alert and oriented x 3 PSYCHIATRIC:  Normal affect    Signed, Shirlee More, MD  10/30/2019 8:25 AM    [facility]

## 2019-11-16 ENCOUNTER — Other Ambulatory Visit: Payer: Self-pay | Admitting: Cardiology

## 2019-12-04 DIAGNOSIS — H52223 Regular astigmatism, bilateral: Secondary | ICD-10-CM | POA: Diagnosis not present

## 2019-12-04 DIAGNOSIS — H5213 Myopia, bilateral: Secondary | ICD-10-CM | POA: Diagnosis not present

## 2019-12-04 DIAGNOSIS — H2513 Age-related nuclear cataract, bilateral: Secondary | ICD-10-CM | POA: Diagnosis not present

## 2019-12-04 DIAGNOSIS — H524 Presbyopia: Secondary | ICD-10-CM | POA: Diagnosis not present

## 2019-12-07 DIAGNOSIS — H52209 Unspecified astigmatism, unspecified eye: Secondary | ICD-10-CM | POA: Diagnosis not present

## 2019-12-07 DIAGNOSIS — H5213 Myopia, bilateral: Secondary | ICD-10-CM | POA: Diagnosis not present

## 2020-01-07 DIAGNOSIS — R42 Dizziness and giddiness: Secondary | ICD-10-CM | POA: Diagnosis not present

## 2020-01-07 DIAGNOSIS — K219 Gastro-esophageal reflux disease without esophagitis: Secondary | ICD-10-CM | POA: Diagnosis not present

## 2020-01-07 DIAGNOSIS — R10813 Right lower quadrant abdominal tenderness: Secondary | ICD-10-CM | POA: Diagnosis not present

## 2020-01-07 DIAGNOSIS — K5792 Diverticulitis of intestine, part unspecified, without perforation or abscess without bleeding: Secondary | ICD-10-CM | POA: Diagnosis not present

## 2020-01-07 DIAGNOSIS — E039 Hypothyroidism, unspecified: Secondary | ICD-10-CM | POA: Diagnosis not present

## 2020-01-07 DIAGNOSIS — R109 Unspecified abdominal pain: Secondary | ICD-10-CM | POA: Diagnosis not present

## 2020-01-07 DIAGNOSIS — M109 Gout, unspecified: Secondary | ICD-10-CM | POA: Diagnosis not present

## 2020-01-07 DIAGNOSIS — R1031 Right lower quadrant pain: Secondary | ICD-10-CM | POA: Diagnosis not present

## 2020-01-07 DIAGNOSIS — R17 Unspecified jaundice: Secondary | ICD-10-CM | POA: Diagnosis not present

## 2020-01-07 DIAGNOSIS — R350 Frequency of micturition: Secondary | ICD-10-CM | POA: Diagnosis not present

## 2020-01-07 DIAGNOSIS — I10 Essential (primary) hypertension: Secondary | ICD-10-CM | POA: Diagnosis not present

## 2020-01-07 DIAGNOSIS — K5720 Diverticulitis of large intestine with perforation and abscess without bleeding: Secondary | ICD-10-CM | POA: Diagnosis not present

## 2020-01-07 DIAGNOSIS — I4819 Other persistent atrial fibrillation: Secondary | ICD-10-CM | POA: Diagnosis not present

## 2020-01-07 DIAGNOSIS — Z6841 Body Mass Index (BMI) 40.0 and over, adult: Secondary | ICD-10-CM | POA: Diagnosis not present

## 2020-01-11 DIAGNOSIS — R52 Pain, unspecified: Secondary | ICD-10-CM | POA: Diagnosis not present

## 2020-01-11 DIAGNOSIS — R1110 Vomiting, unspecified: Secondary | ICD-10-CM | POA: Diagnosis not present

## 2020-01-11 DIAGNOSIS — K5792 Diverticulitis of intestine, part unspecified, without perforation or abscess without bleeding: Secondary | ICD-10-CM | POA: Diagnosis not present

## 2020-01-11 DIAGNOSIS — R1084 Generalized abdominal pain: Secondary | ICD-10-CM | POA: Diagnosis not present

## 2020-01-11 DIAGNOSIS — R0902 Hypoxemia: Secondary | ICD-10-CM | POA: Diagnosis not present

## 2020-01-12 DIAGNOSIS — I4819 Other persistent atrial fibrillation: Secondary | ICD-10-CM | POA: Diagnosis not present

## 2020-01-12 DIAGNOSIS — E039 Hypothyroidism, unspecified: Secondary | ICD-10-CM | POA: Diagnosis not present

## 2020-01-12 DIAGNOSIS — M109 Gout, unspecified: Secondary | ICD-10-CM | POA: Diagnosis not present

## 2020-01-12 DIAGNOSIS — R1110 Vomiting, unspecified: Secondary | ICD-10-CM | POA: Diagnosis not present

## 2020-01-12 DIAGNOSIS — N179 Acute kidney failure, unspecified: Secondary | ICD-10-CM | POA: Diagnosis not present

## 2020-01-12 DIAGNOSIS — K5732 Diverticulitis of large intestine without perforation or abscess without bleeding: Secondary | ICD-10-CM | POA: Diagnosis not present

## 2020-01-12 DIAGNOSIS — T360X5A Adverse effect of penicillins, initial encounter: Secondary | ICD-10-CM | POA: Diagnosis not present

## 2020-01-12 DIAGNOSIS — Z6841 Body Mass Index (BMI) 40.0 and over, adult: Secondary | ICD-10-CM | POA: Diagnosis not present

## 2020-01-12 DIAGNOSIS — I10 Essential (primary) hypertension: Secondary | ICD-10-CM | POA: Diagnosis not present

## 2020-01-12 DIAGNOSIS — K219 Gastro-esophageal reflux disease without esophagitis: Secondary | ICD-10-CM | POA: Diagnosis not present

## 2020-01-12 DIAGNOSIS — R109 Unspecified abdominal pain: Secondary | ICD-10-CM | POA: Diagnosis not present

## 2020-01-12 DIAGNOSIS — K5792 Diverticulitis of intestine, part unspecified, without perforation or abscess without bleeding: Secondary | ICD-10-CM | POA: Diagnosis not present

## 2020-01-27 DIAGNOSIS — E039 Hypothyroidism, unspecified: Secondary | ICD-10-CM | POA: Diagnosis not present

## 2020-01-27 DIAGNOSIS — Z7689 Persons encountering health services in other specified circumstances: Secondary | ICD-10-CM | POA: Diagnosis not present

## 2020-01-27 DIAGNOSIS — H6120 Impacted cerumen, unspecified ear: Secondary | ICD-10-CM | POA: Diagnosis not present

## 2020-01-27 DIAGNOSIS — M109 Gout, unspecified: Secondary | ICD-10-CM | POA: Diagnosis not present

## 2020-01-27 DIAGNOSIS — I482 Chronic atrial fibrillation, unspecified: Secondary | ICD-10-CM | POA: Diagnosis not present

## 2020-01-27 DIAGNOSIS — I4891 Unspecified atrial fibrillation: Secondary | ICD-10-CM | POA: Diagnosis not present

## 2020-01-27 DIAGNOSIS — Z6838 Body mass index (BMI) 38.0-38.9, adult: Secondary | ICD-10-CM | POA: Diagnosis not present

## 2020-01-27 DIAGNOSIS — K5792 Diverticulitis of intestine, part unspecified, without perforation or abscess without bleeding: Secondary | ICD-10-CM | POA: Diagnosis not present

## 2020-01-27 DIAGNOSIS — E785 Hyperlipidemia, unspecified: Secondary | ICD-10-CM | POA: Diagnosis not present

## 2020-02-01 DIAGNOSIS — K5792 Diverticulitis of intestine, part unspecified, without perforation or abscess without bleeding: Secondary | ICD-10-CM | POA: Diagnosis not present

## 2020-02-01 DIAGNOSIS — E876 Hypokalemia: Secondary | ICD-10-CM | POA: Diagnosis not present

## 2020-02-11 DIAGNOSIS — E875 Hyperkalemia: Secondary | ICD-10-CM | POA: Diagnosis not present

## 2020-02-29 DIAGNOSIS — Z1159 Encounter for screening for other viral diseases: Secondary | ICD-10-CM

## 2020-02-29 DIAGNOSIS — Z6838 Body mass index (BMI) 38.0-38.9, adult: Secondary | ICD-10-CM | POA: Insufficient documentation

## 2020-02-29 DIAGNOSIS — K429 Umbilical hernia without obstruction or gangrene: Secondary | ICD-10-CM

## 2020-02-29 DIAGNOSIS — K5732 Diverticulitis of large intestine without perforation or abscess without bleeding: Secondary | ICD-10-CM | POA: Insufficient documentation

## 2020-02-29 HISTORY — DX: Body mass index (BMI) 38.0-38.9, adult: Z68.38

## 2020-02-29 HISTORY — DX: Umbilical hernia without obstruction or gangrene: K42.9

## 2020-02-29 HISTORY — DX: Diverticulitis of large intestine without perforation or abscess without bleeding: K57.32

## 2020-02-29 HISTORY — DX: Encounter for screening for other viral diseases: Z11.59

## 2020-03-02 ENCOUNTER — Other Ambulatory Visit: Payer: Self-pay

## 2020-03-02 ENCOUNTER — Ambulatory Visit: Payer: Medicare HMO | Admitting: Cardiology

## 2020-03-02 ENCOUNTER — Encounter: Payer: Self-pay | Admitting: Cardiology

## 2020-03-02 VITALS — BP 108/60 | HR 51 | Ht 72.0 in | Wt 283.4 lb

## 2020-03-02 DIAGNOSIS — I482 Chronic atrial fibrillation, unspecified: Secondary | ICD-10-CM

## 2020-03-02 DIAGNOSIS — I130 Hypertensive heart and chronic kidney disease with heart failure and stage 1 through stage 4 chronic kidney disease, or unspecified chronic kidney disease: Secondary | ICD-10-CM | POA: Diagnosis not present

## 2020-03-02 DIAGNOSIS — I421 Obstructive hypertrophic cardiomyopathy: Secondary | ICD-10-CM

## 2020-03-02 DIAGNOSIS — E782 Mixed hyperlipidemia: Secondary | ICD-10-CM

## 2020-03-02 MED ORDER — TELMISARTAN 40 MG PO TABS: 20.0000 mg | ORAL_TABLET | Freq: Every day | ORAL | 1 refills | Status: DC

## 2020-03-02 NOTE — Progress Notes (Signed)
Cardiology Office Note:    Date:  03/02/2020   ID:  [name], DOB 1955-10-08, MRN [id number]  PCP:  Nicoletta Dress, MD  Cardiologist:  Shirlee More, MD    Referring MD: Nicoletta Dress, MD    ASSESSMENT:    1. Hypertrophic obstructive cardiomyopathy ([place])   2. Chronic a-fib ([place])   3. Hypertensive heart and chronic kidney disease with heart failure and stage 1 through stage 4 chronic kidney disease, or chronic kidney disease ([place])   4. Mixed hyperlipidemia    PLAN:    In order of problems listed above:  1. He is doing well he is asymptomatic no murmur on exam blood pressure is markedly improved with change in diet weight loss and will start to reduce hopefully discontinue his vasodilator.  Continue his beta-blocker and other antihypertensives 2. Rate is controlled continue beta-blocker he has declined anticoagulation aware of the risk of stroke 3. Stable kidney function improved during hospitalization BP at target 4. Continue with statin   Next appointment: 6 months   Medication Adjustments/Labs and Tests Ordered: Current medicines are reviewed at length with the patient today.  Concerns regarding medicines are outlined above.  Orders Placed This Encounter  Procedures  . EKG 12-Lead   Meds ordered this encounter  Medications  . telmisartan (MICARDIS) 40 MG tablet    Sig: Take 0.5 tablets (20 mg total) by mouth daily.    Dispense:  90 tablet    Refill:  1    Chief Complaint  Patient presents with  . Follow-up    He has hypertrophic cardiomyopathy  . Cardiomyopathy  . Hypertension    Previously resistant  . Atrial Fibrillation    History of Present Illness:    [name] is a 64 y.o. male with a hx of obstructive hypertrophic cardiomyopathy, heart failure, chronic atrial fibrillation, hypertension and CKD   last seen 10/30/2019.  Prior to that visit he is also found to be hypothyroid and initiated on replacement.  His predominant complaint was  erectile dysfunction and is initiated on Cialis. Compliance with diet, lifestyle and medications: Yes he has lost close to 30 pounds and does sodium restrict at this time  He is feeling better he is having no abdominal pain and is pending colonoscopy in September.  He is very careful with his diet meticulously follows his blood pressure and is predominantly in the range of 160-7 20 systolic.  He takes multiple agents and I am going to have him decrease his ARB and hopefully can discontinue it is problematic with obstructive hypertrophic cardiomyopathy and potentially can worsen his gradients.  He is having no shortness of breath chest pain or recurrent syncope.  When I first met him he had a syncopal episode and labile hypertension and symptomatic hypotension.  He was admitted to [facility] health 01/12/2020 discharge 01/18/2020 with acute diverticulitis.  I independently reviewed his EKG 06 or 03/2020 that shows rate controlled atrial fibrillation nonspecific ST changes.  His initial white count was severely elevated 20,800 which normalized with IV antibiotic therapy at discharge hemoglobin 14.0 white count 10,600 creatinine 0.9 potassium 4.0 his initial creatinine was elevated at 1.50 CT of abdomen and pelvis pelvis showed findings of sigmoid diverticulitis hepatic steatosis and aortic atherosclerosis. Past Medical History:  Diagnosis Date  . Atrial fibrillation ([place]) 02/26/2011  . Chest pain   . CHF (congestive heart failure) ([place])   . Chronic anticoagulation 02/17/2015  . Chronic gout 02/21/2015  . ED (erectile dysfunction)   .  Hypertension   . Hypertensive heart and chronic kidney disease with heart failure and stage 1 through stage 4 chronic kidney disease, or chronic kidney disease ([place]) 02/17/2015  . Hypertr obst cardiomyop   . Hypertrophic obstructive cardiomyopathy ([place]) 02/26/2011   Overview:  Last Assessment & Plan:  He is doing very well from a cardiac standpoint. He denies any episodes of chest  pain. His systolic murmur is very quiet and I do not think that he has a significant outflow gradient at this time.  . Mitral regurgitation   . MVP (mitral valve prolapse)   . PAF (paroxysmal atrial fibrillation) ([place])     Past Surgical History:  Procedure Laterality Date  . CARDIOVASCULAR STRESS TEST  09/01/2002   EF 35%  . CHOLECYSTECTOMY    . COLONOSCOPY  07/10/2012   Colonic polyps, status post polypectomy. Moderate predominantly sigmoid diverticulosis. Small internal hermorrhoids.   . INGUINAL HERNIA REPAIR     age 8  . TONSILLECTOMY    . TRANSESOPHAGEAL ECHOCARDIOGRAM  09/13/2008  . TRANSTHORACIC ECHOCARDIOGRAM  09/10/2008   EF 70-75%    Current Medications: Current Meds  Medication Sig  . allopurinol (ZYLOPRIM) 300 MG tablet Take 300 mg by mouth daily.   Marland Kitchen aspirin 81 MG tablet Take 81 mg by mouth daily.    . calcium carbonate (CALCIUM 600) 600 MG TABS tablet Take 600 mg by mouth daily.  . carvedilol (COREG) 25 MG tablet TAKE 1 TABLET TWICE DAILY  . cloNIDine (CATAPRES) 0.1 MG tablet TAKE 1 TABLET TWICE DAILY  . famotidine (PEPCID) 40 MG tablet Take 40 mg by mouth daily.  Marland Kitchen levothyroxine (SYNTHROID) 25 MCG tablet Take 25 mcg by mouth daily before breakfast.  . meclizine (ANTIVERT) 25 MG tablet Take 1 tablet by mouth daily as needed.  . pantoprazole (PROTONIX) 40 MG tablet Take 1 tablet (40 mg total) by mouth daily. Take 30 minutes before breakfast.  . spironolactone (ALDACTONE) 25 MG tablet TAKE 1 TABLET EVERY DAY  . tadalafil (CIALIS) 5 MG tablet Take one tablet by mouth daily as needed  . telmisartan (MICARDIS) 40 MG tablet Take 0.5 tablets (20 mg total) by mouth daily.  . [DISCONTINUED] Colchicine 0.6 MG CAPS Take 0.6 mg by mouth daily as needed.  . [DISCONTINUED] telmisartan (MICARDIS) 40 MG tablet TAKE 1 TABLET (40 MG TOTAL) BY MOUTH DAILY.     Allergies:   Codeine, Dabigatran, Warfarin, and Xarelto [rivaroxaban]   Social History   Socioeconomic History  . Marital  status: Married    Spouse name: Not on file  . Number of children: 4  . Years of education: Not on file  . Highest education level: Not on file  Occupational History  . Not on file  Tobacco Use  . Smoking status: Never Smoker  . Smokeless tobacco: Never Used  Vaping Use  . Vaping Use: Never used  Substance and Sexual Activity  . Alcohol use: No  . Drug use: No  . Sexual activity: Not on file  Other Topics Concern  . Not on file  Social History Narrative  . Not on file   Social Determinants of Health   Financial Resource Strain:   . Difficulty of Paying Living Expenses:   Food Insecurity:   . Worried About Charity fundraiser in the Last Year:   . Arboriculturist in the Last Year:   Transportation Needs:   . Film/video editor (Medical):   Marland Kitchen Lack of Transportation (Non-Medical):   Physical  Activity:   . Days of Exercise per Week:   . Minutes of Exercise per Session:   Stress:   . Feeling of Stress :   Social Connections:   . Frequency of Communication with Friends and Family:   . Frequency of Social Gatherings with Friends and Family:   . Attends Religious Services:   . Active Member of Clubs or Organizations:   . Attends Archivist Meetings:   Marland Kitchen Marital Status:      Family History: The patient's family history includes Alcohol abuse in his father; COPD in his mother; Colon cancer in his mother. There is no history of Esophageal cancer. ROS:   Please see the history of present illness.    All other systems reviewed and are negative.  EKGs/Labs/Other Studies Reviewed:    The following studies were reviewed today:  EKG:  EKG ordered today and personally reviewed.  The ekg ordered today demonstrates stable rate controlled atrial fibrillation   Physical Exam:    VS:  BP (!) 108/60 (BP Location: Right Arm, Patient Position: Sitting, Cuff Size: Large)   Pulse 51   Ht 6' (1.829 m)   Wt (!) 283 lb 6.4 oz (128.5 kg)   SpO2 97%   BMI 38.44 kg/m       Wt Readings from Last 3 Encounters:  03/02/20 (!) 283 lb 6.4 oz (128.5 kg)  10/30/19 (!) 306 lb (138.8 kg)  04/09/19 (!) 304 lb 6 oz (138.1 kg)     GEN: He appears markedly improved noticeable weight loss well nourished, well developed in no acute distress HEENT: Normal NECK: No JVD; No carotid bruits LYMPHATICS: No lymphadenopathy CARDIAC: Irregular S1 variable he has no murmur, no rubs, gallops RESPIRATORY:  Clear to auscultation without rales, wheezing or rhonchi  ABDOMEN: Soft, non-tender, non-distended MUSCULOSKELETAL:  No edema; No deformity  SKIN: Warm and dry NEUROLOGIC:  Alert and oriented x 3 PSYCHIATRIC:  Normal affect    Signed, Shirlee More, MD  03/02/2020 9:27 AM    [place]

## 2020-03-02 NOTE — Patient Instructions (Signed)
Medication Instructions:  Your physician has recommended you make the following change in your medication:  DECREASE: Telmasartan 20 mg per 0.5 tablet by mouth daily. Call us if your Blood pressure is below 120 on top after two weeks of decreasing this medication and we can consider stopping it.  *If you need a refill on your cardiac medications before your next appointment, please call your pharmacy*   Lab Work: None If you have labs (blood work) drawn today and your tests are completely normal, you will receive your results only by: Marland Kitchen MyChart Message (if you have MyChart) OR . A paper copy in the mail If you have any lab test that is abnormal or we need to change your treatment, we will call you to review the results.   Testing/Procedures: None   Follow-Up: At [facility], you and your health needs are our priority.  As part of our continuing mission to provide you with exceptional heart care, we have created designated Provider Care Teams.  These Care Teams include your primary Cardiologist (physician) and Advanced Practice Providers (APPs -  Physician Assistants and Nurse Practitioners) who all work together to provide you with the care you need, when you need it.  We recommend signing up for the patient portal called "MyChart".  Sign up information is provided on this After Visit Summary.  MyChart is used to connect with patients for Virtual Visits (Telemedicine).  Patients are able to view lab/test results, encounter notes, upcoming appointments, etc.  Non-urgent messages can be sent to your provider as well.   To learn more about what you can do with MyChart, go to [email or web address].    Your next appointment:   6 month(s)  The format for your next appointment:   In Person  Provider:   Shirlee More, MD   Other Instructions

## 2020-03-09 ENCOUNTER — Other Ambulatory Visit: Payer: Self-pay | Admitting: Cardiology

## 2020-03-18 ENCOUNTER — Other Ambulatory Visit: Payer: Self-pay | Admitting: Cardiology

## 2020-04-01 DIAGNOSIS — Z01812 Encounter for preprocedural laboratory examination: Secondary | ICD-10-CM | POA: Diagnosis not present

## 2020-04-01 DIAGNOSIS — K5732 Diverticulitis of large intestine without perforation or abscess without bleeding: Secondary | ICD-10-CM | POA: Diagnosis not present

## 2020-04-01 DIAGNOSIS — Z20822 Contact with and (suspected) exposure to covid-19: Secondary | ICD-10-CM | POA: Diagnosis not present

## 2020-04-07 DIAGNOSIS — K5730 Diverticulosis of large intestine without perforation or abscess without bleeding: Secondary | ICD-10-CM | POA: Diagnosis not present

## 2020-04-07 DIAGNOSIS — I10 Essential (primary) hypertension: Secondary | ICD-10-CM | POA: Diagnosis not present

## 2020-04-07 DIAGNOSIS — E039 Hypothyroidism, unspecified: Secondary | ICD-10-CM | POA: Diagnosis not present

## 2020-04-07 DIAGNOSIS — K5732 Diverticulitis of large intestine without perforation or abscess without bleeding: Secondary | ICD-10-CM | POA: Diagnosis not present

## 2020-04-29 ENCOUNTER — Telehealth: Payer: Self-pay

## 2020-04-29 NOTE — Telephone Encounter (Signed)
Left VM for patient to callback to confirm he received the message to stop his MIcardis per Dr. Bettina Gavia.

## 2020-05-11 ENCOUNTER — Other Ambulatory Visit: Payer: Self-pay | Admitting: Cardiology

## 2020-08-15 DIAGNOSIS — Z1331 Encounter for screening for depression: Secondary | ICD-10-CM | POA: Diagnosis not present

## 2020-08-15 DIAGNOSIS — E785 Hyperlipidemia, unspecified: Secondary | ICD-10-CM | POA: Diagnosis not present

## 2020-08-15 DIAGNOSIS — Z23 Encounter for immunization: Secondary | ICD-10-CM | POA: Diagnosis not present

## 2020-08-15 DIAGNOSIS — Z139 Encounter for screening, unspecified: Secondary | ICD-10-CM | POA: Diagnosis not present

## 2020-08-15 DIAGNOSIS — Z9181 History of falling: Secondary | ICD-10-CM | POA: Diagnosis not present

## 2020-08-15 DIAGNOSIS — E669 Obesity, unspecified: Secondary | ICD-10-CM | POA: Diagnosis not present

## 2020-08-15 DIAGNOSIS — Z0000 Encounter for general adult medical examination without abnormal findings: Secondary | ICD-10-CM | POA: Diagnosis not present

## 2020-08-31 DIAGNOSIS — I482 Chronic atrial fibrillation, unspecified: Secondary | ICD-10-CM | POA: Diagnosis not present

## 2020-08-31 DIAGNOSIS — Z131 Encounter for screening for diabetes mellitus: Secondary | ICD-10-CM | POA: Diagnosis not present

## 2020-08-31 DIAGNOSIS — I129 Hypertensive chronic kidney disease with stage 1 through stage 4 chronic kidney disease, or unspecified chronic kidney disease: Secondary | ICD-10-CM | POA: Diagnosis not present

## 2020-08-31 DIAGNOSIS — E039 Hypothyroidism, unspecified: Secondary | ICD-10-CM | POA: Diagnosis not present

## 2020-08-31 DIAGNOSIS — Z6837 Body mass index (BMI) 37.0-37.9, adult: Secondary | ICD-10-CM | POA: Diagnosis not present

## 2020-08-31 DIAGNOSIS — M109 Gout, unspecified: Secondary | ICD-10-CM | POA: Diagnosis not present

## 2020-08-31 DIAGNOSIS — Z125 Encounter for screening for malignant neoplasm of prostate: Secondary | ICD-10-CM | POA: Diagnosis not present

## 2020-08-31 DIAGNOSIS — E785 Hyperlipidemia, unspecified: Secondary | ICD-10-CM | POA: Diagnosis not present

## 2020-08-31 DIAGNOSIS — N182 Chronic kidney disease, stage 2 (mild): Secondary | ICD-10-CM | POA: Diagnosis not present

## 2020-12-08 DIAGNOSIS — I341 Nonrheumatic mitral (valve) prolapse: Secondary | ICD-10-CM | POA: Insufficient documentation

## 2020-12-08 DIAGNOSIS — N529 Male erectile dysfunction, unspecified: Secondary | ICD-10-CM | POA: Insufficient documentation

## 2020-12-08 DIAGNOSIS — I10 Essential (primary) hypertension: Secondary | ICD-10-CM | POA: Insufficient documentation

## 2020-12-08 DIAGNOSIS — I340 Nonrheumatic mitral (valve) insufficiency: Secondary | ICD-10-CM | POA: Insufficient documentation

## 2020-12-08 DIAGNOSIS — I480 Paroxysmal atrial fibrillation: Secondary | ICD-10-CM | POA: Insufficient documentation

## 2020-12-08 DIAGNOSIS — I509 Heart failure, unspecified: Secondary | ICD-10-CM | POA: Insufficient documentation

## 2020-12-28 ENCOUNTER — Ambulatory Visit: Payer: Medicare HMO | Admitting: Cardiology

## 2020-12-28 ENCOUNTER — Other Ambulatory Visit: Payer: Self-pay

## 2020-12-28 ENCOUNTER — Encounter: Payer: Self-pay | Admitting: Cardiology

## 2020-12-28 VITALS — BP 116/84 | HR 82 | Ht 72.0 in | Wt 300.4 lb

## 2020-12-28 DIAGNOSIS — I482 Chronic atrial fibrillation, unspecified: Secondary | ICD-10-CM | POA: Diagnosis not present

## 2020-12-28 DIAGNOSIS — I130 Hypertensive heart and chronic kidney disease with heart failure and stage 1 through stage 4 chronic kidney disease, or unspecified chronic kidney disease: Secondary | ICD-10-CM

## 2020-12-28 DIAGNOSIS — I421 Obstructive hypertrophic cardiomyopathy: Secondary | ICD-10-CM

## 2020-12-28 NOTE — Patient Instructions (Signed)

## 2020-12-28 NOTE — Progress Notes (Signed)
Cardiology Office Note:    Date:  12/28/2020   ID:  [name], DOB 1956-03-17, MRN [id number]  PCP:  Nicoletta Dress, MD  Cardiologist:  Shirlee More, MD    Referring MD: Nicoletta Dress, MD    ASSESSMENT:    1. Hypertrophic obstructive cardiomyopathy ([place])   2. Hypertensive heart and chronic kidney disease with heart failure and stage 1 through stage 4 chronic kidney disease, or chronic kidney disease ([place])   3. Chronic a-fib (HCC)    PLAN:    In order of problems listed above:  1. He remains asymptomatic and in general is doing well with his obstructive hypertrophic cardiomyopathy, previously required loop diuretic at this time.  I asked to recheck an echocardiogram regarding gradients wall thickness and ejection fraction. 2. Well-controlled continue central active clonidine beta-blocker and MRA and avoid direct vasodilators 3. Rate controlled he takes aspirin and declines anticoagulation was not interested in the watchman device   Next appointment: 6 months   Medication Adjustments/Labs and Tests Ordered: Current medicines are reviewed at length with the patient today.  Concerns regarding medicines are outlined above.  Orders Placed This Encounter  Procedures  . ECHOCARDIOGRAM COMPLETE   No orders of the defined types were placed in this encounter.   Chief Complaint  Patient presents with  . Follow-up    He has obstructive hypertrophic cardiomyopathy  . Hypertension  . Congestive Heart Failure  . Chronic Kidney Disease    History of Present Illness:    [name] is a 65 y.o. male with a hx of obstructive hypertrophic cardiomyopathy with heart failure chronic atrial fibrillation hypertension CKD and hypothyroidism last seen 03/02/2020. Compliance with diet, lifestyle and medications: Yes  He has no other family members with hypertrophic cardiomyopathy His blood pressure is very well controlled 1 50-1 20s 70 to 80s No edema exertional shortness of  breath chest pain palpitation or syncope. He does have shortness of breath when he is flying back and has some snoring he does not have PNd He also has coughing with colitis did not progress to strengthening. He has had no palpitations syncope He continues to decline anticoagulation engage in the discussion of watchman device is not interested at this time Recent labs 08/31/2020 cholesterol 158 LDL 95 triglycerides 201 HDL 28 A1c 5.6%  Past Medical History:  Diagnosis Date  . Atrial fibrillation ([place]) 02/26/2011  . Chest pain   . CHF (congestive heart failure) ([place])   . Chronic anticoagulation 02/17/2015  . Chronic gout 02/21/2015  . ED (erectile dysfunction)   . Hypertension   . Hypertensive heart and chronic kidney disease with heart failure and stage 1 through stage 4 chronic kidney disease, or chronic kidney disease ([place]) 02/17/2015  . Hypertr obst cardiomyop   . Hypertrophic obstructive cardiomyopathy ([place]) 02/26/2011   Overview:  Last Assessment & Plan:  He is doing very well from a cardiac standpoint. He denies any episodes of chest pain. His systolic murmur is very quiet and I do not think that he has a significant outflow gradient at this time.  . Mitral regurgitation   . MVP (mitral valve prolapse)   . PAF (paroxysmal atrial fibrillation) ([place])     Past Surgical History:  Procedure Laterality Date  . CARDIOVASCULAR STRESS TEST  09/01/2002   EF 35%  . CHOLECYSTECTOMY    . COLONOSCOPY  07/10/2012   Colonic polyps, status post polypectomy. Moderate predominantly sigmoid diverticulosis. Small internal hermorrhoids.   . INGUINAL HERNIA  REPAIR     age 62  . TONSILLECTOMY    . TRANSESOPHAGEAL ECHOCARDIOGRAM  09/13/2008  . TRANSTHORACIC ECHOCARDIOGRAM  09/10/2008   EF 70-75%    Current Medications: Current Meds  Medication Sig  . allopurinol (ZYLOPRIM) 300 MG tablet Take 300 mg by mouth daily.   Marland Kitchen aspirin 81 MG tablet Take 81 mg by mouth daily.  . calcium carbonate (OS-CAL) 600 MG  TABS tablet Take 600 mg by mouth daily.  . carvedilol (COREG) 25 MG tablet TAKE 1 TABLET TWICE DAILY  . cloNIDine (CATAPRES) 0.1 MG tablet TAKE 1 TABLET TWICE DAILY  . colchicine 0.6 MG tablet Take 0.6 mg by mouth daily as needed (Gout).  . famotidine (PEPCID) 40 MG tablet Take 40 mg by mouth daily as needed for heartburn or indigestion.  Marland Kitchen levothyroxine (SYNTHROID) 25 MCG tablet Take 25 mcg by mouth daily before breakfast.  . meclizine (ANTIVERT) 25 MG tablet Take 1 tablet by mouth daily as needed for dizziness.  Marland Kitchen spironolactone (ALDACTONE) 25 MG tablet TAKE 1 TABLET EVERY DAY  . tadalafil (CIALIS) 5 MG tablet Take one tablet by mouth daily as needed (Patient taking differently: 5 mg daily as needed for erectile dysfunction. Take one tablet by mouth daily as needed)     Allergies:   Codeine, Dabigatran, Warfarin, and Xarelto [rivaroxaban]   Social History   Socioeconomic History  . Marital status: Married    Spouse name: Not on file  . Number of children: 4  . Years of education: Not on file  . Highest education level: Not on file  Occupational History  . Not on file  Tobacco Use  . Smoking status: Never Smoker  . Smokeless tobacco: Never Used  Vaping Use  . Vaping Use: Never used  Substance and Sexual Activity  . Alcohol use: No  . Drug use: No  . Sexual activity: Not on file  Other Topics Concern  . Not on file  Social History Narrative  . Not on file   Social Determinants of Health   Financial Resource Strain: Not on file  Food Insecurity: Not on file  Transportation Needs: Not on file  Physical Activity: Not on file  Stress: Not on file  Social Connections: Not on file     Family History: The patient's family history includes Alcohol abuse in his father; COPD in his mother; Colon cancer in his mother. There is no history of Esophageal cancer. ROS:   Please see the history of present illness.    All other systems reviewed and are negative.  EKGs/Labs/Other  Studies Reviewed:    The following studies were reviewed today:    Physical Exam:    VS:  BP 116/84 (BP Location: Left Arm, Cuff Size: Large)   Pulse 82   Ht 6' (1.829 m)   Wt (!) 300 lb 6.4 oz (136.3 kg)   SpO2 98%   BMI 40.74 kg/m     Wt Readings from Last 3 Encounters:  12/28/20 (!) 300 lb 6.4 oz (136.3 kg)  03/02/20 (!) 283 lb 6.4 oz (128.5 kg)  10/30/19 (!) 306 lb (138.8 kg)     GEN: Obese, Greater than 40 well nourished, well developed in no acute distress HEENT: Normal NECK: No JVD; No carotid bruits LYMPHATICS: No lymphadenopathy CARDIAC: Irregular rhythm no murmur no murmurs, rubs, gallops RESPIRATORY:  Clear to auscultation without rales, wheezing or rhonchi  ABDOMEN: Soft, non-tender, non-distended MUSCULOSKELETAL:  No edema; No deformity  SKIN: Warm and dry NEUROLOGIC:  Alert and oriented x 3 PSYCHIATRIC:  Normal affect    Signed, Shirlee More, MD  12/28/2020 4:45 PM    [facility]

## 2021-01-10 DIAGNOSIS — Z6839 Body mass index (BMI) 39.0-39.9, adult: Secondary | ICD-10-CM | POA: Diagnosis not present

## 2021-01-10 DIAGNOSIS — M109 Gout, unspecified: Secondary | ICD-10-CM | POA: Diagnosis not present

## 2021-01-11 ENCOUNTER — Other Ambulatory Visit: Payer: Self-pay | Admitting: Cardiology

## 2021-01-23 ENCOUNTER — Other Ambulatory Visit: Payer: Self-pay

## 2021-01-23 ENCOUNTER — Ambulatory Visit (INDEPENDENT_AMBULATORY_CARE_PROVIDER_SITE_OTHER): Payer: Medicare HMO

## 2021-01-23 DIAGNOSIS — I421 Obstructive hypertrophic cardiomyopathy: Secondary | ICD-10-CM | POA: Diagnosis not present

## 2021-01-23 LAB — ECHOCARDIOGRAM COMPLETE
Area-P 1/2: 4.77 cm2
S' Lateral: 3.3 cm

## 2021-01-23 NOTE — Progress Notes (Signed)
Complete echocardiogram performed.  Jimmy Lizbett Garciagarcia RDCS, RVT  

## 2021-01-24 DIAGNOSIS — M25562 Pain in left knee: Secondary | ICD-10-CM | POA: Diagnosis not present

## 2021-01-24 DIAGNOSIS — M545 Low back pain, unspecified: Secondary | ICD-10-CM | POA: Diagnosis not present

## 2021-01-30 ENCOUNTER — Telehealth: Payer: Self-pay

## 2021-01-30 DIAGNOSIS — I7789 Other specified disorders of arteries and arterioles: Secondary | ICD-10-CM

## 2021-01-30 NOTE — Telephone Encounter (Signed)
-----   Message from Richardo Priest, MD sent at 01/29/2021 10:51 AM EDT ----- The enlargement of the thoracic aorta is new he should have a follow-up CT of the chest performed for thoracic aortic aneurysm.

## 2021-01-30 NOTE — Telephone Encounter (Signed)
Spoke with patient regarding results and recommendation.  Patient verbalizes understanding and is agreeable to plan of care. Advised patient to call back with any issues or concerns.  

## 2021-02-07 ENCOUNTER — Other Ambulatory Visit: Payer: Self-pay

## 2021-02-07 DIAGNOSIS — I10 Essential (primary) hypertension: Secondary | ICD-10-CM

## 2021-02-08 ENCOUNTER — Other Ambulatory Visit: Payer: Self-pay

## 2021-02-08 ENCOUNTER — Telehealth: Payer: Self-pay

## 2021-02-08 ENCOUNTER — Telehealth: Payer: Self-pay | Admitting: Cardiology

## 2021-02-08 DIAGNOSIS — I10 Essential (primary) hypertension: Secondary | ICD-10-CM

## 2021-02-08 LAB — BASIC METABOLIC PANEL
BUN/Creatinine Ratio: 15 (ref 10–24)
BUN: 19 mg/dL (ref 8–27)
CO2: 24 mmol/L (ref 20–29)
Calcium: 9.6 mg/dL (ref 8.6–10.2)
Chloride: 101 mmol/L (ref 96–106)
Creatinine, Ser: 1.29 mg/dL — ABNORMAL HIGH (ref 0.76–1.27)
Glucose: 144 mg/dL — ABNORMAL HIGH (ref 65–99)
Potassium: 4.9 mmol/L (ref 3.5–5.2)
Sodium: 140 mmol/L (ref 134–144)
eGFR: 61 mL/min/{1.73_m2} (ref >59)

## 2021-02-08 NOTE — Telephone Encounter (Signed)
-----   Message from Richardo Priest, MD sent at 02/08/2021  4:41 PM EDT ----- Normal or stable result  No changes

## 2021-02-08 NOTE — Telephone Encounter (Signed)
Spoke to the patient just now and he let me know that his CT is going to cost him $300 to have it done. He wanted to know the purpose of this test. I advised that we were doing it to confirm the size of the enlargement of his aorta. He verbalizes understanding and thanks me for calling him back.

## 2021-02-08 NOTE — Telephone Encounter (Signed)
Spoke with patient regarding results and recommendation.  Patient verbalizes understanding and is agreeable to plan of care. Advised patient to call back with any issues or concerns.  

## 2021-02-08 NOTE — Telephone Encounter (Signed)
Patient called to talk to Dr. Bettina Gavia or nurse in regards to CT

## 2021-02-09 ENCOUNTER — Ambulatory Visit (HOSPITAL_BASED_OUTPATIENT_CLINIC_OR_DEPARTMENT_OTHER): Admission: RE | Admit: 2021-02-09 | Payer: Medicare HMO | Source: Ambulatory Visit

## 2021-03-06 DIAGNOSIS — Z6839 Body mass index (BMI) 39.0-39.9, adult: Secondary | ICD-10-CM | POA: Diagnosis not present

## 2021-03-06 DIAGNOSIS — N182 Chronic kidney disease, stage 2 (mild): Secondary | ICD-10-CM | POA: Diagnosis not present

## 2021-03-06 DIAGNOSIS — I482 Chronic atrial fibrillation, unspecified: Secondary | ICD-10-CM | POA: Diagnosis not present

## 2021-03-06 DIAGNOSIS — I129 Hypertensive chronic kidney disease with stage 1 through stage 4 chronic kidney disease, or unspecified chronic kidney disease: Secondary | ICD-10-CM | POA: Diagnosis not present

## 2021-03-06 DIAGNOSIS — E785 Hyperlipidemia, unspecified: Secondary | ICD-10-CM | POA: Diagnosis not present

## 2021-03-06 DIAGNOSIS — E039 Hypothyroidism, unspecified: Secondary | ICD-10-CM | POA: Diagnosis not present

## 2021-03-06 DIAGNOSIS — M109 Gout, unspecified: Secondary | ICD-10-CM | POA: Diagnosis not present

## 2021-05-15 IMAGING — RF DG ESOPHAGUS
6 series · 23 of 24 positions shown · non-contrast
Comparison: None.

CLINICAL DATA: Difficulty with swallowing large tablets.

EXAM:
ESOPHOGRAM/BARIUM SWALLOW
TECHNIQUE: Combined double contrast and single contrast examination performed
using effervescent crystals, thick barium liquid, and thin barium
liquid.
FLUOROSCOPY TIME:  Fluoroscopy Time:  2 minutes
Radiation Exposure Index (if provided by the fluoroscopic device):
52.60 mGy
Number of Acquired Spot Images: 0

[Series 1: cp_standard · 0.53mm/px · 4 of 237 frames shown (1 of 6)]
[frame 36/237]
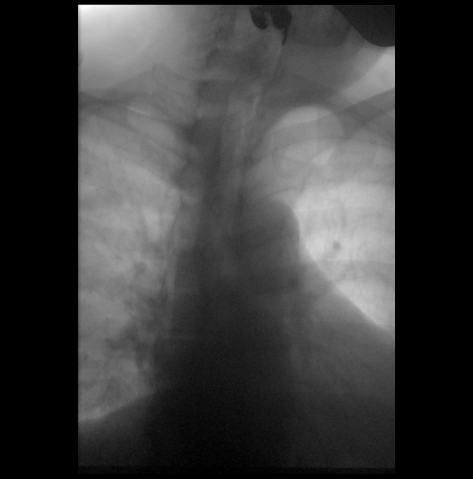
[frame 119/237]
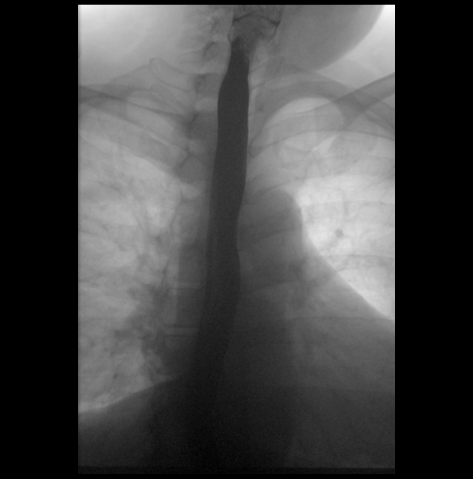
[frame 202/237]
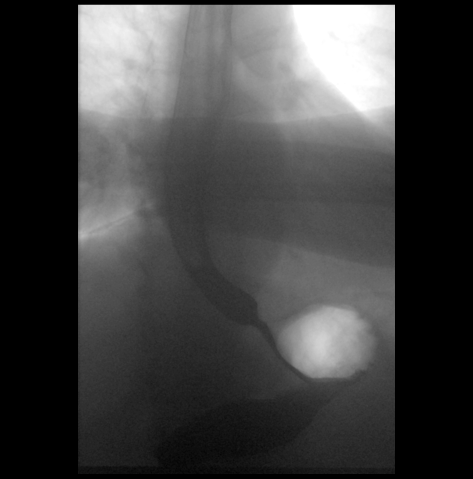
[frame 237/237]
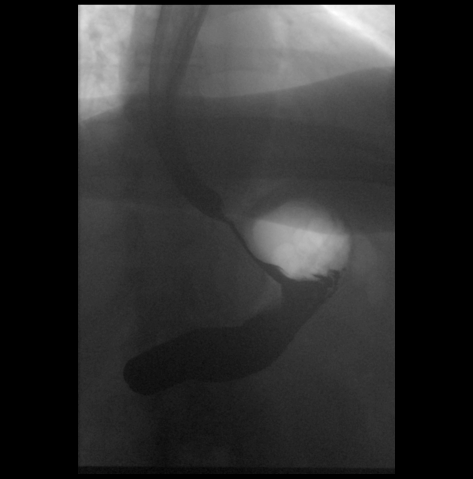

[Series 2: cp_standard · 0.51mm/px · 4 of 301 frames shown (2 of 6)]
[frame 46/301]
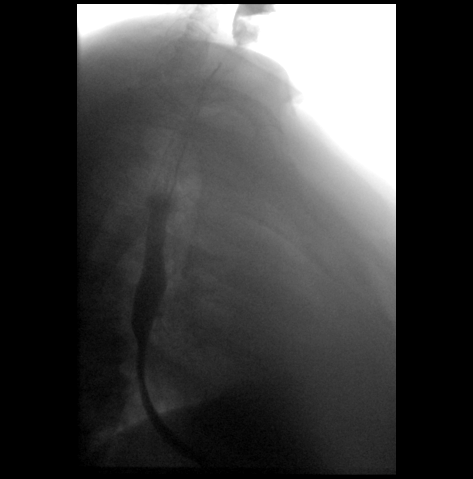
[frame 151/301]
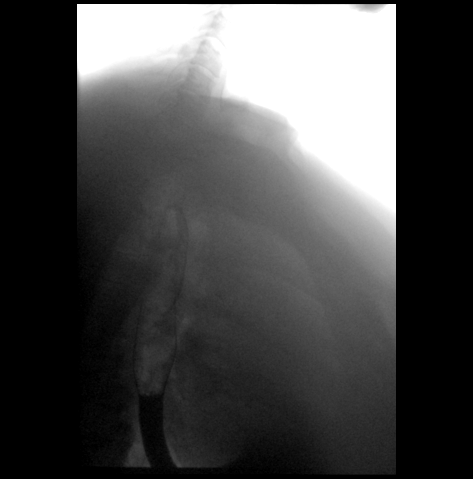
[frame 256/301]
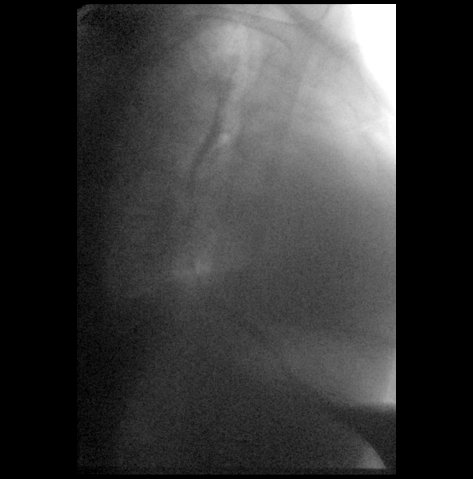
[frame 280/301]
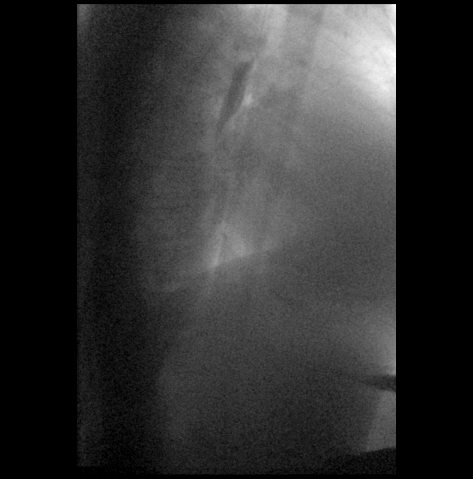

[Series 3: cp_standard · 0.54mm/px · 3 of 59 frames shown (3 of 6)]
[frame 1/59]
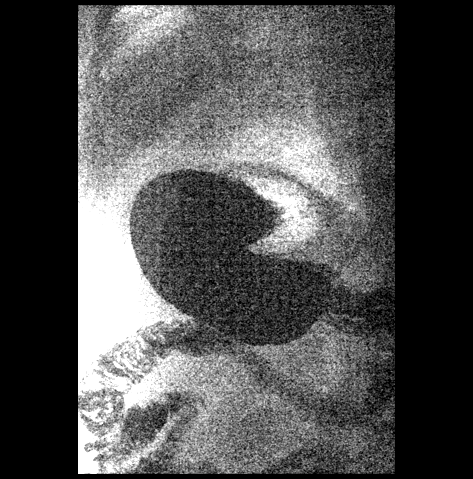
[frame 9/59]
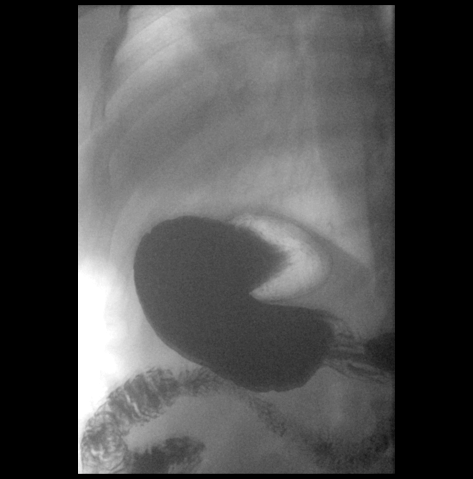
[frame 30/59]
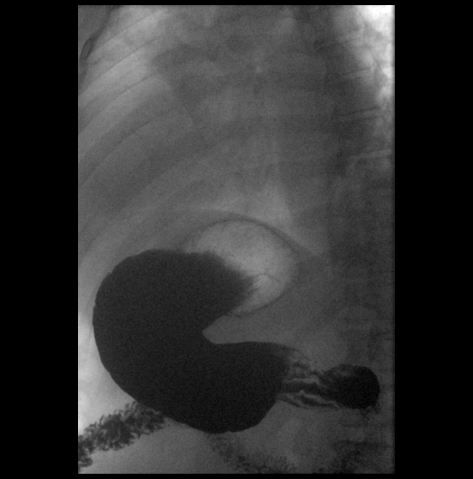

[Series 4: cp_standard · 0.54mm/px · 4 of 496 frames shown (4 of 6)]
[frame 75/496]
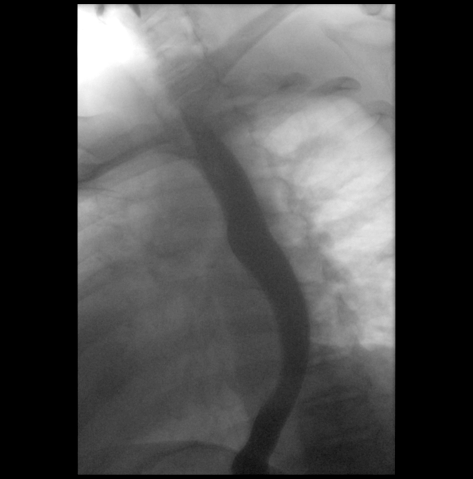
[frame 154/496]
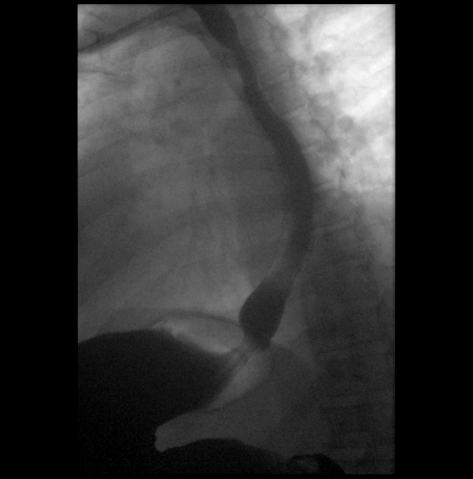
[frame 249/496]
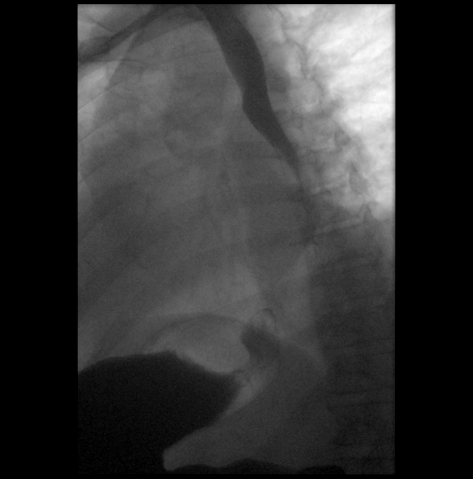
[frame 422/496]
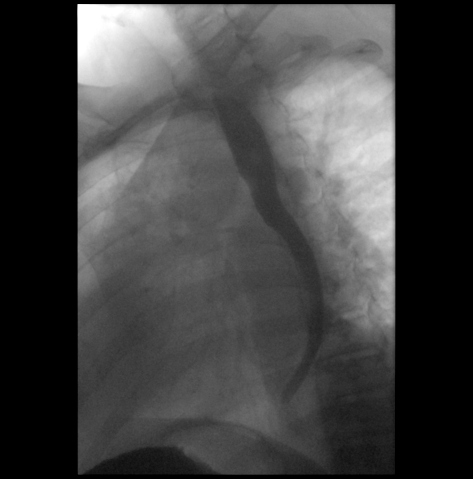

[Series 5: cp_standard · 0.60mm/px · 4 of 86 frames shown (5 of 6)]
[frame 1/86]
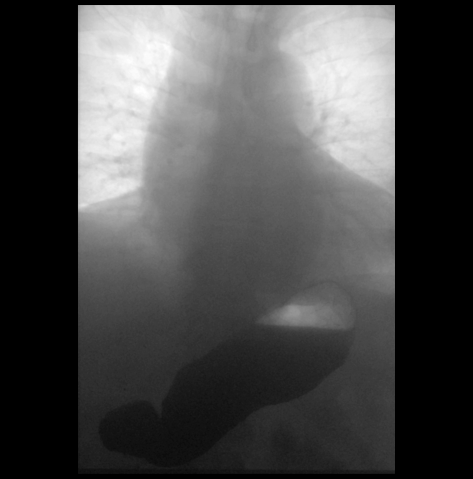
[frame 13/86]
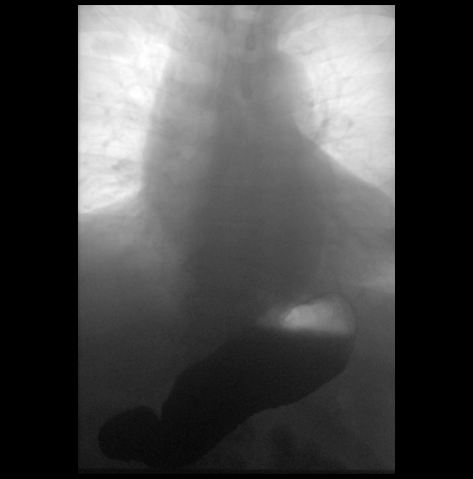
[frame 44/86]
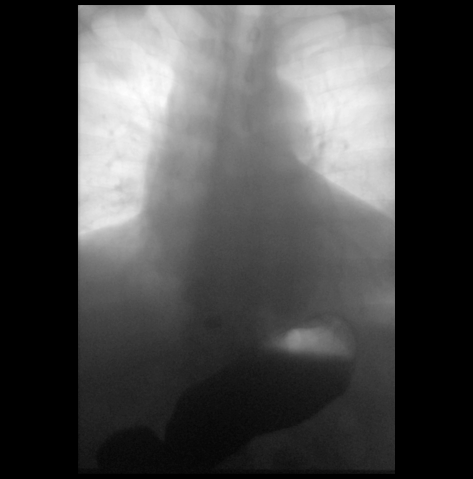
[frame 74/86]
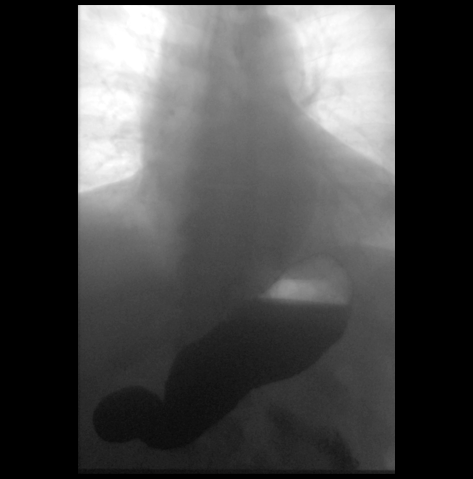

[Series 6: cp_standard · 0.60mm/px · 4 of 14 frames shown (6 of 6)]
[frame 1/14]
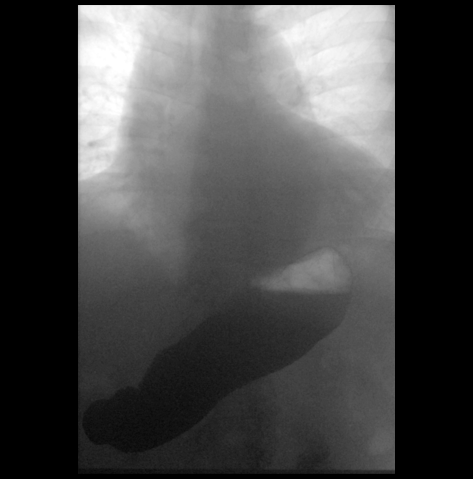
[frame 3/14]
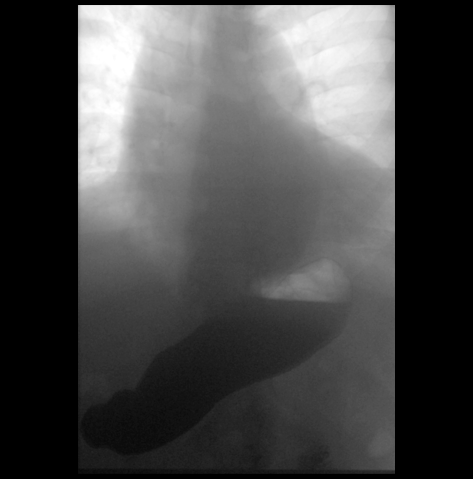
[frame 8/14]
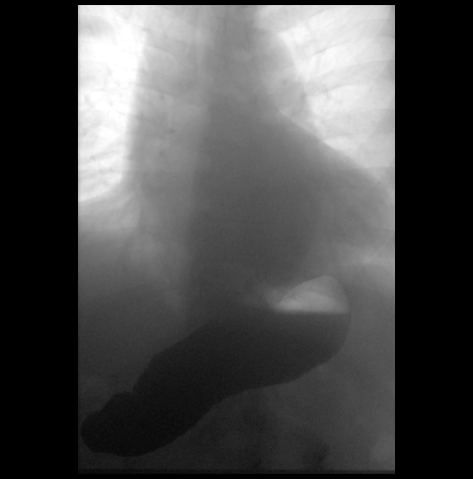
[frame 12/14]
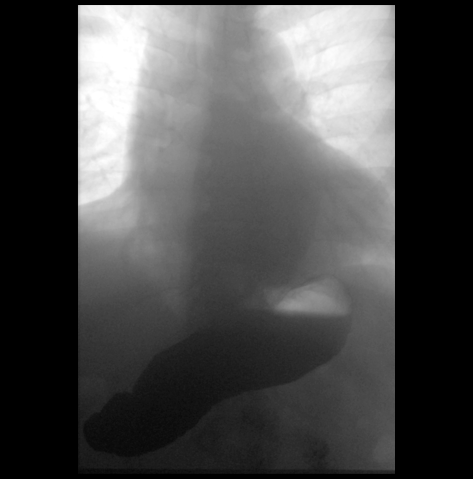

[23 of 24 positions shown; findings below may reference images not displayed]

FINDINGS: The hypopharynx demonstrated no filling defects and was normal in
contour. The esophagus was smooth in contour. Mild tertiary
contractions and decreased motility of the esophagus. There was no
narrowing or stricture. There was no delay in the passage of a
barium tablet into the stomach. There was no significant hiatal
hernia. There was no evidence of gastroesophageal reflux with simple
provocative maneuvers.
IMPRESSION: 1. Mild tertiary contractions and decreased motility of the
esophagus.
2. No evidence of narrowing or stricture.

## 2021-07-31 DIAGNOSIS — J209 Acute bronchitis, unspecified: Secondary | ICD-10-CM | POA: Diagnosis not present

## 2021-07-31 DIAGNOSIS — R0981 Nasal congestion: Secondary | ICD-10-CM | POA: Diagnosis not present

## 2021-07-31 DIAGNOSIS — R051 Acute cough: Secondary | ICD-10-CM | POA: Diagnosis not present

## 2021-07-31 DIAGNOSIS — Z20828 Contact with and (suspected) exposure to other viral communicable diseases: Secondary | ICD-10-CM | POA: Diagnosis not present

## 2021-08-17 DIAGNOSIS — J209 Acute bronchitis, unspecified: Secondary | ICD-10-CM | POA: Diagnosis not present

## 2021-08-17 DIAGNOSIS — R051 Acute cough: Secondary | ICD-10-CM | POA: Diagnosis not present

## 2021-08-17 DIAGNOSIS — Z20828 Contact with and (suspected) exposure to other viral communicable diseases: Secondary | ICD-10-CM | POA: Diagnosis not present

## 2021-08-21 DIAGNOSIS — I482 Chronic atrial fibrillation, unspecified: Secondary | ICD-10-CM | POA: Diagnosis not present

## 2021-08-21 DIAGNOSIS — I10 Essential (primary) hypertension: Secondary | ICD-10-CM | POA: Diagnosis not present

## 2021-08-21 DIAGNOSIS — E039 Hypothyroidism, unspecified: Secondary | ICD-10-CM | POA: Diagnosis not present

## 2021-08-21 DIAGNOSIS — I129 Hypertensive chronic kidney disease with stage 1 through stage 4 chronic kidney disease, or unspecified chronic kidney disease: Secondary | ICD-10-CM | POA: Diagnosis not present

## 2021-08-21 DIAGNOSIS — N182 Chronic kidney disease, stage 2 (mild): Secondary | ICD-10-CM | POA: Diagnosis not present

## 2021-08-21 DIAGNOSIS — M109 Gout, unspecified: Secondary | ICD-10-CM | POA: Diagnosis not present

## 2021-08-21 DIAGNOSIS — E785 Hyperlipidemia, unspecified: Secondary | ICD-10-CM | POA: Diagnosis not present

## 2022-01-19 ENCOUNTER — Ambulatory Visit: Payer: Medicare HMO | Admitting: Cardiology

## 2022-01-19 VITALS — BP 126/82 | HR 84 | Ht 72.0 in | Wt 304.6 lb

## 2022-01-19 DIAGNOSIS — I482 Chronic atrial fibrillation, unspecified: Secondary | ICD-10-CM

## 2022-01-19 DIAGNOSIS — I7789 Other specified disorders of arteries and arterioles: Secondary | ICD-10-CM

## 2022-01-19 DIAGNOSIS — E782 Mixed hyperlipidemia: Secondary | ICD-10-CM

## 2022-01-19 DIAGNOSIS — I130 Hypertensive heart and chronic kidney disease with heart failure and stage 1 through stage 4 chronic kidney disease, or unspecified chronic kidney disease: Secondary | ICD-10-CM

## 2022-01-19 DIAGNOSIS — I421 Obstructive hypertrophic cardiomyopathy: Secondary | ICD-10-CM | POA: Diagnosis not present

## 2022-01-19 NOTE — Progress Notes (Signed)
Cardiology Office Note:    Date:  01/19/2022   ID:  [name], DOB 1955-09-29, MRN [id number]  PCP:  Nicoletta Dress, MD  Cardiologist:  Shirlee More, MD    Referring MD: Nicoletta Dress, MD    ASSESSMENT:    1. Hypertrophic obstructive cardiomyopathy ([place])   2. Enlarged thoracic aorta ([place])   3. Hypertensive heart and chronic kidney disease with heart failure and stage 1 through stage 4 chronic kidney disease, or chronic kidney disease ([place])   4. Mixed hyperlipidemia   5. Chronic a-fib (HCC)    PLAN:    In order of problems listed above:  Continue to do well with his hypertrophic cardiomyopathy no findings of outflow tract obstruction asymptomatic blood pressure controlled he has chosen not to be anticoagulated with his atrial fibrillation heart rate controlled continue aspirin beta-blocker clonidine and spironolactone.  Again I encouraged him to have his family screen Check CT of chest no contrast to precisely define diameter and decide on frequency of follow-up Good lipids currently not on lipid-lowering therapy   Next appointment: 9 months   Medication Adjustments/Labs and Tests Ordered: Current medicines are reviewed at length with the patient today.  Concerns regarding medicines are outlined above.  No orders of the defined types were placed in this encounter.  No orders of the defined types were placed in this encounter.   Chief Complaint  Patient presents with   Medication Management    Corcidin    History of Present Illness:    [name] is a 66 y.o. male with a hx of  obstructive hypertrophic cardiomyopathy with heart failure chronic atrial fibrillation hypertension CKD and hypothyroidism  last seen 12/28/2021.  Compliance with diet, lifestyle and medications: Yes  I reviewed his echocardiogram a year ago.  Enlargement ascending aorta 49 mm we reviewed the results I told him at times it is difficult to define true measurements coaxially think  is best served with a CT scan without contrast and he agrees. Fortunately has no family history of aortopathy or hypertrophic cardiomyopathy Again I encouraged him to have his sunscreen he said he spoken to them and they have decided not He remains quite physically active no exercise intolerance edema shortness of breath chest pain palpitation or syncope He meticulously tracks his home blood pressure runs from 110-125/70 Heart rate is always in the range of 70-90 He had lab work done at his primary care physician's office in January cholesterol 153 LDL 87 A1c 5.6 hemoglobin 14.9 creatinine 1.22 mildly elevated potassium 5.1 Past Medical History:  Diagnosis Date   Atrial fibrillation ([place]) 02/26/2011   Chest pain    CHF (congestive heart failure) ([place])    Chronic anticoagulation 02/17/2015   Chronic gout 02/21/2015   ED (erectile dysfunction)    Hypertension    Hypertensive heart and chronic kidney disease with heart failure and stage 1 through stage 4 chronic kidney disease, or chronic kidney disease ([place]) 02/17/2015   Hypertr obst cardiomyop    Hypertrophic obstructive cardiomyopathy ([place]) 02/26/2011   Overview:  Last Assessment & Plan:  He is doing very well from a cardiac standpoint. He denies any episodes of chest pain. His systolic murmur is very quiet and I do not think that he has a significant outflow gradient at this time.   Mitral regurgitation    MVP (mitral valve prolapse)    PAF (paroxysmal atrial fibrillation) (HCC)     Past Surgical History:  Procedure Laterality Date  CARDIOVASCULAR STRESS TEST  09/01/2002   EF 35%   CHOLECYSTECTOMY     COLONOSCOPY  07/10/2012   Colonic polyps, status post polypectomy. Moderate predominantly sigmoid diverticulosis. Small internal hermorrhoids.    INGUINAL HERNIA REPAIR     age 14   TONSILLECTOMY     TRANSESOPHAGEAL ECHOCARDIOGRAM  09/13/2008   TRANSTHORACIC ECHOCARDIOGRAM  09/10/2008   EF 70-75%    Current Medications: Current Meds   Medication Sig   allopurinol (ZYLOPRIM) 300 MG tablet Take 300 mg by mouth daily.    aspirin 81 MG tablet Take 81 mg by mouth daily.   calcium carbonate (OS-CAL) 600 MG TABS tablet Take 600 mg by mouth daily.   carvedilol (COREG) 25 MG tablet TAKE 1 TABLET TWICE DAILY   cloNIDine (CATAPRES) 0.1 MG tablet TAKE 1 TABLET TWICE DAILY (Patient taking differently: Take 0.1 mg by mouth daily.)   colchicine 0.6 MG tablet Take 0.6 mg by mouth daily as needed (Gout).   famotidine (PEPCID) 40 MG tablet Take 40 mg by mouth daily as needed for heartburn or indigestion.   levothyroxine (SYNTHROID) 25 MCG tablet Take 25 mcg by mouth daily before breakfast.   meclizine (ANTIVERT) 25 MG tablet Take 1 tablet by mouth daily as needed for dizziness.   spironolactone (ALDACTONE) 25 MG tablet TAKE 1 TABLET EVERY DAY (Patient taking differently: Take 25 mg by mouth daily.)   tadalafil (CIALIS) 5 MG tablet Take one tablet by mouth daily as needed (Patient taking differently: Take 5 mg by mouth daily as needed for erectile dysfunction. Take one tablet by mouth daily as needed)   TART CHERRY PO Take 3,000 mg by mouth daily.   Turmeric (QC TUMERIC COMPLEX PO) Take 1,500 mg by mouth daily.     Allergies:   Codeine, Dabigatran, Warfarin, and Xarelto [rivaroxaban]   Social History   Socioeconomic History   Marital status: Married    Spouse name: Not on file   Number of children: 4   Years of education: Not on file   Highest education level: Not on file  Occupational History   Not on file  Tobacco Use   Smoking status: Never   Smokeless tobacco: Never  Vaping Use   Vaping Use: Never used  Substance and Sexual Activity   Alcohol use: No   Drug use: No   Sexual activity: Not on file  Other Topics Concern   Not on file  Social History Narrative   Not on file   Social Determinants of Health   Financial Resource Strain: Not on file  Food Insecurity: Not on file  Transportation Needs: Not on file   Physical Activity: Not on file  Stress: Not on file  Social Connections: Not on file     Family History: The patient's family history includes Alcohol abuse in his father; COPD in his mother; Colon cancer in his mother. There is no history of Esophageal cancer. ROS:   Please see the history of present illness.    All other systems reviewed and are negative.  EKGs/Labs/Other Studies Reviewed:    The following studies were reviewed today:     Physical Exam:    VS:  BP 126/82 (BP Location: Right Arm, Patient Position: Sitting)   Pulse 84   Ht 6' (1.829 m)   Wt (!) 304 lb 9.6 oz (138.2 kg)   SpO2 96%   BMI 41.31 kg/m     Wt Readings from Last 3 Encounters:  01/19/22 (!) 304 lb 9.6 oz (  138.2 kg)  12/28/20 (!) 300 lb 6.4 oz (136.3 kg)  03/02/20 (!) 283 lb 6.4 oz (128.5 kg)     GEN:  Well nourished, well developed in no acute distress HEENT: Normal NECK: No JVD; No carotid bruits LYMPHATICS: No lymphadenopathy CARDIAC: Regular rhythm he has no outflow murmur RRR, no murmurs, rubs, gallops RESPIRATORY:  Clear to auscultation without rales, wheezing or rhonchi  ABDOMEN: Soft, non-tender, non-distended MUSCULOSKELETAL:  No edema; No deformity  SKIN: Warm and dry NEUROLOGIC:  Alert and oriented x 3 PSYCHIATRIC:  Normal affect    Signed, Shirlee More, MD  01/19/2022 9:25 AM    [place]

## 2022-01-19 NOTE — Patient Instructions (Addendum)
Medication Instructions:  Your physician recommends that you continue on your current medications as directed. Please refer to the Current Medication list given to you today.  *If you need a refill on your cardiac medications before your next appointment, please call your pharmacy*   Lab Work: None Ordered If you have labs (blood work) drawn today and your tests are completely normal, you will receive your results only by: [place] (if you have MyChart) OR A paper copy in the mail If you have any lab test that is abnormal or we need to change your treatment, we will call you to review the results.   Testing/Procedures: Non-Cardiac CT scanning, (CAT scanning), is a noninvasive, special x-ray that produces cross-sectional images of the body using x-rays and a computer. CT scans help physicians diagnose and treat medical conditions. For some CT exams, a contrast material is used to enhance visibility in the area of the body being studied. CT scans provide greater clarity and reveal more details than regular x-ray exams.    Follow-Up: At [facility], you and your health needs are our priority.  As part of our continuing mission to provide you with exceptional heart care, we have created designated Provider Care Teams.  These Care Teams include your primary Cardiologist (physician) and Advanced Practice Providers (APPs -  Physician Assistants and Nurse Practitioners) who all work together to provide you with the care you need, when you need it.  We recommend signing up for the patient portal called "MyChart".  Sign up information is provided on this After Visit Summary.  MyChart is used to connect with patients for Virtual Visits (Telemedicine).  Patients are able to view lab/test results, encounter notes, upcoming appointments, etc.  Non-urgent messages can be sent to your provider as well.   To learn more about what you can do with MyChart, go to [email or web address].    Your next  appointment:   9 month(s)  The format for your next appointment:   In Person  Provider:   Shirlee More, MD    Other Instructions NA

## 2022-01-29 ENCOUNTER — Other Ambulatory Visit: Payer: Self-pay | Admitting: Cardiology

## 2022-02-19 DIAGNOSIS — E781 Pure hyperglyceridemia: Secondary | ICD-10-CM | POA: Diagnosis not present

## 2022-02-19 DIAGNOSIS — E039 Hypothyroidism, unspecified: Secondary | ICD-10-CM | POA: Diagnosis not present

## 2022-02-19 DIAGNOSIS — I10 Essential (primary) hypertension: Secondary | ICD-10-CM | POA: Diagnosis not present

## 2022-02-19 DIAGNOSIS — I482 Chronic atrial fibrillation, unspecified: Secondary | ICD-10-CM | POA: Diagnosis not present

## 2022-02-19 DIAGNOSIS — Z125 Encounter for screening for malignant neoplasm of prostate: Secondary | ICD-10-CM | POA: Diagnosis not present

## 2022-02-19 DIAGNOSIS — K219 Gastro-esophageal reflux disease without esophagitis: Secondary | ICD-10-CM | POA: Diagnosis not present

## 2022-02-19 DIAGNOSIS — Z6841 Body Mass Index (BMI) 40.0 and over, adult: Secondary | ICD-10-CM | POA: Diagnosis not present

## 2022-02-19 DIAGNOSIS — E785 Hyperlipidemia, unspecified: Secondary | ICD-10-CM | POA: Diagnosis not present

## 2022-02-19 DIAGNOSIS — M109 Gout, unspecified: Secondary | ICD-10-CM | POA: Diagnosis not present

## 2022-03-30 ENCOUNTER — Telehealth: Payer: Self-pay

## 2022-03-30 NOTE — Telephone Encounter (Signed)
   Pre-operative Risk Assessment    Patient Name: [name]  DOB: 12-18-1955 MRN: [id number]     Request for Surgical Clearance{ 1. What type of surgery is being performed? Enter name of procedure below and number of teeth if dental extraction.  1    Procedure:  Dental Extraction - Amount of Teeth to be Pulled:  Extraction and deep cleaning  Date of Surgery:  Clearance TBD                                 Surgeon:  [place]  Surgeon's Group or Practice Name:  [facility] number:  [phone] Fax number:  [phone]   Type of Clearance Requested:   Aspirin   Type of Anesthesia:  Not Indicated   Additional requests/questions:    Daylene Katayama   03/30/2022, 4:14 PM

## 2022-04-04 NOTE — Telephone Encounter (Signed)
   Patient Name:  [name]  DOB:  03/17/1956  MRN:  [id number]   Primary Cardiologist: Shirlee More, MD  Chart reviewed as part of pre-operative protocol coverage.   Simple dental extractions are considered low risk procedures per guidelines and generally do not require any specific cardiac clearance. It is also generally accepted that for simple extractions and dental cleanings, there is no need to interrupt blood thinner therapy.  SBE prophylaxis is not required for the patient from a cardiac standpoint.  Please call with questions.  Richardson Dopp, PA-C 04/04/2022, 12:05 PM

## 2022-04-04 NOTE — Telephone Encounter (Signed)
Notes faxed to surgeon. This phone note will be removed from the preop pool. Richardson Dopp, PA-C  04/04/2022 12:07 PM

## 2022-04-11 DIAGNOSIS — R69 Illness, unspecified: Secondary | ICD-10-CM | POA: Diagnosis not present

## 2022-05-02 DIAGNOSIS — I872 Venous insufficiency (chronic) (peripheral): Secondary | ICD-10-CM | POA: Diagnosis not present

## 2022-05-02 DIAGNOSIS — Z6841 Body Mass Index (BMI) 40.0 and over, adult: Secondary | ICD-10-CM | POA: Diagnosis not present

## 2022-05-02 DIAGNOSIS — Z23 Encounter for immunization: Secondary | ICD-10-CM | POA: Diagnosis not present

## 2022-05-02 DIAGNOSIS — L0390 Cellulitis, unspecified: Secondary | ICD-10-CM | POA: Diagnosis not present

## 2022-05-10 DIAGNOSIS — I872 Venous insufficiency (chronic) (peripheral): Secondary | ICD-10-CM | POA: Diagnosis not present

## 2022-05-10 DIAGNOSIS — Z6841 Body Mass Index (BMI) 40.0 and over, adult: Secondary | ICD-10-CM | POA: Diagnosis not present

## 2022-05-10 DIAGNOSIS — L989 Disorder of the skin and subcutaneous tissue, unspecified: Secondary | ICD-10-CM | POA: Diagnosis not present

## 2022-05-21 ENCOUNTER — Other Ambulatory Visit: Payer: Self-pay | Admitting: *Deleted

## 2022-05-21 DIAGNOSIS — R600 Localized edema: Secondary | ICD-10-CM

## 2022-05-22 DIAGNOSIS — I10 Essential (primary) hypertension: Secondary | ICD-10-CM | POA: Diagnosis not present

## 2022-05-23 ENCOUNTER — Ambulatory Visit
Admission: RE | Admit: 2022-05-23 | Discharge: 2022-05-23 | Disposition: A | Discharge status: Home / Self Care | Payer: Medicare HMO | Source: Ambulatory Visit | Attending: *Deleted | Admitting: *Deleted

## 2022-05-23 DIAGNOSIS — R600 Localized edema: Secondary | ICD-10-CM

## 2022-08-13 ENCOUNTER — Ambulatory Visit: Payer: Medicare HMO | Admitting: Podiatry

## 2022-08-13 DIAGNOSIS — L600 Ingrowing nail: Secondary | ICD-10-CM | POA: Diagnosis not present

## 2022-08-13 NOTE — Patient Instructions (Signed)

## 2022-08-13 NOTE — Progress Notes (Signed)
Subjective:  Patient ID: [name], male    DOB: [birth year],  MRN: [id number]  Chief Complaint  Patient presents with   Ingrown Toenail    Left ingrown toe nail, great. No pus or bleeding. Patient is not diabetic.    67 y.o. male presents with concern for pincer nail deformity in the left hallux.  He says that it is not significantly painful but he is worried that it will soon become an issue for him.  He has had it worked on multiple times in the past sometimes having the whole nail removed.  He thought it was a permanent nail removal at last time he had it worked on a few years ago but it has since returned.  Denies any drainage from the nail.  Past Medical History:  Diagnosis Date   Atrial fibrillation ([place]) 02/26/2011   Chest pain    CHF (congestive heart failure) (HCC)    Chronic anticoagulation 02/17/2015   Chronic gout 02/21/2015   ED (erectile dysfunction)    Hypertension    Hypertensive heart and chronic kidney disease with heart failure and stage 1 through stage 4 chronic kidney disease, or chronic kidney disease ([place]) 02/17/2015   Hypertr obst cardiomyop    Hypertrophic obstructive cardiomyopathy ([place]) 02/26/2011   Overview:  Last Assessment & Plan:  He is doing very well from a cardiac standpoint. He denies any episodes of chest pain. His systolic murmur is very quiet and I do not think that he has a significant outflow gradient at this time.   Mitral regurgitation    MVP (mitral valve prolapse)    PAF (paroxysmal atrial fibrillation) (HCC)     Allergies  Allergen Reactions   Codeine Swelling   Dabigatran Diarrhea   Warfarin Other (See Comments)    Headache   Xarelto [Rivaroxaban] Diarrhea    ? Diarrhea due to xarelto.      ROS: Negative except as per HPI above  Objective:  General: AAO x3, NAD  Dermatological: Left hallux pincer nail deformity with fungal nail dystrophy as well.  Mild pain with palpation of the borders of the nail.  No drainage erythema or  edema on the nail borders.  Vascular:  Dorsalis Pedis artery and Posterior Tibial artery pedal pulses are 2/4 bilateral.  Capillary fill time < 3 sec to all digits.   Neruologic: Grossly intact via light touch bilateral. Protective threshold intact to all sites bilateral.   Musculoskeletal: No gross boney pedal deformities bilateral. No pain, crepitus, or limitation noted with foot and ankle range of motion bilateral. Muscular strength 5/5 in all groups tested bilateral.  Gait: Unassisted, Nonantalgic.    Assessment:   1. Ingrown nail of great toe of left foot      Plan:  Patient was evaluated and treated and all questions answered.  Ingrown Nail, left hallux total nail matrixectomy -Patient elects to proceed with minor surgery to remove ingrown toenail today. Consent reviewed and signed by patient. -Ingrown nail excised. See procedure note. -Educated on post-procedure care including soaking. Written instructions provided and reviewed. -Patient to follow up in 2 weeks for nail check.  Procedure: Excision of Ingrown Toenail with total nail P&A matrixectomy Location: Left 1st toe  total  nail . Anesthesia: Lidocaine 1% plain; 1.5 mL and Marcaine 0.5% plain; 1.5 mL, digital block. Skin Prep: Betadine. Dressing: Silvadene; telfa; dry, sterile, compression dressing. Technique: Following skin prep, the toe was exsanguinated and a tourniquet was secured at the base of the toe. The  affected nail border was freed, split with a nail splitter, and excised. Chemical matrixectomy was then performed with phenol and irrigated out with alcohol. The tourniquet was then removed and sterile dressing applied. Disposition: Patient tolerated procedure well. Patient to return in 2 weeks for follow-up.    Return in about 2 weeks (around 08/27/2022) for f/u L hallux ingrown P&A.          Everitt Amber, DPM [facility]

## 2022-08-22 DIAGNOSIS — E785 Hyperlipidemia, unspecified: Secondary | ICD-10-CM | POA: Diagnosis not present

## 2022-08-22 DIAGNOSIS — I10 Essential (primary) hypertension: Secondary | ICD-10-CM | POA: Diagnosis not present

## 2022-08-22 DIAGNOSIS — I482 Chronic atrial fibrillation, unspecified: Secondary | ICD-10-CM | POA: Diagnosis not present

## 2022-08-22 DIAGNOSIS — E781 Pure hyperglyceridemia: Secondary | ICD-10-CM | POA: Diagnosis not present

## 2022-08-22 DIAGNOSIS — I4891 Unspecified atrial fibrillation: Secondary | ICD-10-CM | POA: Diagnosis not present

## 2022-08-22 DIAGNOSIS — E039 Hypothyroidism, unspecified: Secondary | ICD-10-CM | POA: Diagnosis not present

## 2022-08-22 DIAGNOSIS — I872 Venous insufficiency (chronic) (peripheral): Secondary | ICD-10-CM | POA: Diagnosis not present

## 2022-08-22 DIAGNOSIS — M109 Gout, unspecified: Secondary | ICD-10-CM | POA: Diagnosis not present

## 2022-08-27 ENCOUNTER — Ambulatory Visit: Payer: Medicare HMO | Admitting: Podiatry

## 2022-08-27 DIAGNOSIS — L600 Ingrowing nail: Secondary | ICD-10-CM

## 2022-08-27 NOTE — Progress Notes (Signed)
Subjective: [name] is a 68 y.o.  male returns to office today for follow up evaluation after having left Hallux total nail ingrown removal with phenol and alcohol matrixectomy approximately 2 weeks ago. Patient has been soaking using epsom salts and applying topical antibiotic covered with bandaid daily. Patient denies fevers, chills, nausea, vomiting. Denies any calf pain, chest pain, SOB. Denies any pain.  Objective:  Vitals: Reviewed  General: Well developed, nourished, in no acute distress, alert and oriented x3   Dermatology: Skin is warm, dry and supple bilateral. Left hallux nail bed appears to be clean, dry, with mild granular tissue and surrounding scab. There is no surrounding erythema, edema, drainage/purulence. The remaining nails appear unremarkable at this time. There are no other lesions or other signs of infection present.  Neurovascular status: Intact. No lower extremity swelling; No pain with calf compression bilateral.  Musculoskeletal: Decreased tenderness to palpation of the left hallux nail bed. Muscular strength within normal limits bilateral.   Assesement and Plan: S/p phenol and alcohol matrixectomy to the  left hallux nail , doing well.   -Continue soaking in epsom salts twice a day followed by antibiotic ointment and a band-aid. Can leave uncovered at night. Continue this until completely healed.  -If the area has not healed in 2 weeks, call the office for follow-up appointment, or sooner if any problems arise.  -Monitor for any signs/symptoms of infection. Call the office immediately if any occur or go directly to the emergency room. Call with any questions/concerns.        Everitt Amber, [place] [facility]                   08/27/2022

## 2022-12-07 DIAGNOSIS — E785 Hyperlipidemia, unspecified: Secondary | ICD-10-CM | POA: Diagnosis not present

## 2022-12-07 DIAGNOSIS — K219 Gastro-esophageal reflux disease without esophagitis: Secondary | ICD-10-CM | POA: Diagnosis not present

## 2022-12-07 DIAGNOSIS — M109 Gout, unspecified: Secondary | ICD-10-CM | POA: Diagnosis not present

## 2022-12-07 DIAGNOSIS — R140 Abdominal distension (gaseous): Secondary | ICD-10-CM | POA: Diagnosis not present

## 2022-12-07 DIAGNOSIS — E781 Pure hyperglyceridemia: Secondary | ICD-10-CM | POA: Diagnosis not present

## 2022-12-07 DIAGNOSIS — R109 Unspecified abdominal pain: Secondary | ICD-10-CM | POA: Diagnosis not present

## 2022-12-07 DIAGNOSIS — I10 Essential (primary) hypertension: Secondary | ICD-10-CM | POA: Diagnosis not present

## 2022-12-07 DIAGNOSIS — Z6836 Body mass index (BMI) 36.0-36.9, adult: Secondary | ICD-10-CM | POA: Diagnosis not present

## 2022-12-07 DIAGNOSIS — E039 Hypothyroidism, unspecified: Secondary | ICD-10-CM | POA: Diagnosis not present

## 2022-12-07 DIAGNOSIS — I482 Chronic atrial fibrillation, unspecified: Secondary | ICD-10-CM | POA: Diagnosis not present

## 2023-02-20 DIAGNOSIS — Z6841 Body Mass Index (BMI) 40.0 and over, adult: Secondary | ICD-10-CM | POA: Diagnosis not present

## 2023-02-20 DIAGNOSIS — I10 Essential (primary) hypertension: Secondary | ICD-10-CM | POA: Diagnosis not present

## 2023-02-20 DIAGNOSIS — I482 Chronic atrial fibrillation, unspecified: Secondary | ICD-10-CM | POA: Diagnosis not present

## 2023-02-20 DIAGNOSIS — M109 Gout, unspecified: Secondary | ICD-10-CM | POA: Diagnosis not present

## 2023-02-20 DIAGNOSIS — K219 Gastro-esophageal reflux disease without esophagitis: Secondary | ICD-10-CM | POA: Diagnosis not present

## 2023-02-20 DIAGNOSIS — I4891 Unspecified atrial fibrillation: Secondary | ICD-10-CM | POA: Diagnosis not present

## 2023-02-20 DIAGNOSIS — I872 Venous insufficiency (chronic) (peripheral): Secondary | ICD-10-CM | POA: Diagnosis not present

## 2023-02-20 DIAGNOSIS — E039 Hypothyroidism, unspecified: Secondary | ICD-10-CM | POA: Diagnosis not present

## 2023-02-20 DIAGNOSIS — E781 Pure hyperglyceridemia: Secondary | ICD-10-CM | POA: Diagnosis not present

## 2023-02-21 DIAGNOSIS — Z9181 History of falling: Secondary | ICD-10-CM | POA: Diagnosis not present

## 2023-02-21 DIAGNOSIS — Z1331 Encounter for screening for depression: Secondary | ICD-10-CM | POA: Diagnosis not present

## 2023-02-26 ENCOUNTER — Other Ambulatory Visit: Payer: Self-pay

## 2023-02-28 NOTE — Progress Notes (Signed)
Cardiology Office Note:    Date:  03/01/2023   ID:  [name], DOB 1955/11/28, MRN [id number]  PCP:  [name]  Cardiologist:  [name]    Referring Adams: [name]    ASSESSMENT:    1. Hypertrophic obstructive cardiomyopathy (HCC)   2. Enlarged thoracic aorta (HCC)   3. Hypertensive heart and chronic kidney disease with heart failure and stage 1 through stage 4 chronic kidney disease, or chronic kidney disease (HCC)   4. Chronic a-fib (HCC)   5. Mixed hyperlipidemia    PLAN:    In order of problems listed above:  [name] continues to do well with his obstructive hypertrophic cardiomyopathy asymptomatic New York Heart Association class I and no finding of murmur on examination.  Continue his medical treatment including his beta-blocker. Pretension is well-controlled kidney function is stable Rate controlled atrial fibrillation he has declined anticoagulation For general measure should take aspirin 81 mg daily If not for relocation I would do an echocardiogram in our practice with his enlargement ascending aorta and obstructive hypertrophic cardiomyopathy but I think it is best to do it with his new physicians in their practice.   Next appointment: Advised him to follow-up with his new physicians in 6 months.   Medication Adjustments/Labs and Tests Ordered: Current medicines are reviewed at length with the patient today.  Concerns regarding medicines are outlined above.  Orders Placed This Encounter  Procedures   EKG 12-Lead   Meds ordered this encounter  Medications   aspirin EC 81 MG tablet    Sig: Take 1 tablet (81 mg total) by mouth daily. Swallow whole.     History of Present Illness:    [name] is a 67 y.o. male with a hx of obstructive hypertrophic cardiomyopathy with heart failure chronic atrial fibrillation with patient deferring anticoagulation hypertension CKD and hypothyroidism last seen 01/19/2022.  Compliance with  diet, lifestyle and medications: Yes  He has a life transition he is moving to [place] city to be closer to his daughter I gave him information to seek out a physician associated with [facility] and a fellow of the [employer] of cardiology he continues to do well and has had no cardiovascular symptoms of edema shortness of breath chest pain palpitation or syncope Recent labs performed with his primary care physician shows a cholesterol 173 LDL 103 A1c 5.6 creatinine 1.2 hemoglobin 14.9 potassium 4.6 TSH normal 4.3 Past Medical History:  Diagnosis Date   Atrial fibrillation (HCC) 02/26/2011   BMI 38.0-38.9,adult 02/29/2020   CHF (congestive heart failure) (HCC)    Chronic a-fib (HCC) 02/26/2011   Chronic anticoagulation 02/17/2015   Chronic gout 02/21/2015   Diverticulitis of sigmoid colon 02/29/2020   ED (erectile dysfunction)    Hypertension    Hypertensive heart and chronic kidney disease with heart failure and stage 1 through stage 4 chronic kidney disease, or chronic kidney disease (HCC) 02/17/2015   Hypertrophic obstructive cardiomyopathy (HCC) 02/26/2011   Overview:  Last Assessment & Plan:  He is doing very well from a cardiac standpoint. He denies any episodes of chest pain. His systolic murmur is very quiet and I do not think that he has a significant outflow gradient at this time.   Mitral regurgitation    MVP (mitral valve prolapse)    PAF (paroxysmal atrial fibrillation) (HCC)    Special screening examination for viral disease 02/29/2020   Umbilical hernia without obstruction and without gangrene 02/29/2020  Current Medications: Current Meds  Medication Sig   allopurinol (ZYLOPRIM) 300 MG tablet Take 300 mg by mouth daily.    aspirin EC 81 MG tablet Take 1 tablet (81 mg total) by mouth daily. Swallow whole.   calcium carbonate (OS-CAL) 600 MG TABS tablet Take 600 mg by mouth daily.   carvedilol (COREG) 25 MG tablet Take 1 tablet (25 mg total) by mouth  2 (two) times daily.   cloNIDine (CATAPRES) 0.1 MG tablet Take 1 tablet (0.1 mg total) by mouth 2 (two) times daily.   colchicine 0.6 MG tablet Take 0.6 mg by mouth daily as needed (Gout).   dicyclomine (BENTYL) 20 MG tablet Take 20 mg by mouth as needed for spasms.   famotidine (PEPCID) 40 MG tablet Take 40 mg by mouth daily as needed for heartburn or indigestion.   levothyroxine (SYNTHROID) 25 MCG tablet Take 25 mcg by mouth daily before breakfast.   meclizine (ANTIVERT) 25 MG tablet Take 1 tablet by mouth daily as needed for dizziness.   spironolactone (ALDACTONE) 25 MG tablet Take 1 tablet (25 mg total) by mouth daily.   Turmeric (QC TUMERIC COMPLEX PO) Take 1,500 mg by mouth daily.      EKGs/Labs/Other Studies Reviewed:    The following studies were reviewed today:  Cardiac Studies & Procedures       ECHOCARDIOGRAM  ECHOCARDIOGRAM COMPLETE 01/23/2021  Narrative ECHOCARDIOGRAM REPORT    Patient Name:   [name] Date of Exam: 01/23/2021 Medical Rec #:  [id number]     Height:       72.0 in Accession #:    [id number]    Weight:       300.4 lb Date of Birth:  1956/07/08     BSA:          2.533 m Patient Age:    67 years      BP:           116/84 mmHg Patient Gender: M             HR:           76 bpm. Exam Location:  [place]  Procedure: 2D Echo  Indications:    Hypertrophic obstructive cardiomyopathy (HCC) [I42.1 (ICD-10-CM)]  History:        Patient has prior history of Echocardiogram examinations, most recent 01/17/2005. Hypertrophic obstructive cardiomyopathy, Arrythmias:Atrial Fibrillation; Signs/Symptoms:Hypertensive Heart Disease.  Sonographer:    [name] Referring Phys: [name] [name]  IMPRESSIONS   1. Left ventricular ejection fraction, by estimation, is 60 to 65%. The left ventricle has normal function. The left ventricle has no regional wall motion abnormalities. There is moderate left ventricular hypertrophy. Left ventricular  diastolic parameters are indeterminate. Could not comment on the LVH in more detail; study is suboptimal. 2. Right ventricular systolic function is normal. The right ventricular size is normal. There is normal pulmonary artery systolic pressure. 3. Left atrial size was severely dilated. 4. The mitral valve is normal in structure. No evidence of mitral valve regurgitation. No evidence of mitral stenosis. 5. The aortic valve is normal in structure. Aortic valve regurgitation is mild. Mild aortic valve sclerosis is present, with no evidence of aortic valve stenosis. 6. Aneurysm of the ascending aorta, measuring 49 mm. 7. The inferior vena cava is normal in size with greater than 50% respiratory variability, suggesting right atrial pressure of 3 mmHg.  FINDINGS Left Ventricle: Left ventricular ejection fraction, by estimation, is 60 to 65%. The left ventricle has  normal function. The left ventricle has no regional wall motion abnormalities. The left ventricular internal cavity size was normal in size. There is moderate left ventricular hypertrophy. Left ventricular diastolic parameters are indeterminate.  Right Ventricle: The right ventricular size is normal. No increase in right ventricular wall thickness. Right ventricular systolic function is normal. There is normal pulmonary artery systolic pressure. The tricuspid regurgitant velocity is 1.80 m/s, and with an assumed right atrial pressure of 3 mmHg, the estimated right ventricular systolic pressure is 16.0 mmHg.  Left Atrium: Left atrial size was severely dilated.  Right Atrium: Right atrial size was normal in size.  Pericardium: There is no evidence of pericardial effusion.  Mitral Valve: The mitral valve is normal in structure. No evidence of mitral valve regurgitation. No evidence of mitral valve stenosis.  Tricuspid Valve: The tricuspid valve is normal in structure. Tricuspid valve regurgitation is not demonstrated. No evidence of  tricuspid stenosis.  Aortic Valve: The aortic valve is normal in structure. Aortic valve regurgitation is mild. Mild aortic valve sclerosis is present, with no evidence of aortic valve stenosis.  Pulmonic Valve: The pulmonic valve was normal in structure. Pulmonic valve regurgitation is not visualized. No evidence of pulmonic stenosis.  Aorta: The aortic root is normal in size and structure. There is an aneurysm involving the ascending aorta measuring 49 mm.  Venous: The inferior vena cava is normal in size with greater than 50% respiratory variability, suggesting right atrial pressure of 3 mmHg.  IAS/Shunts: No atrial level shunt detected by color flow Doppler.   LEFT VENTRICLE PLAX 2D LVIDd:         5.30 cm  Diastology LVIDs:         3.30 cm  LV e' medial:    6.85 cm/s LV PW:         1.80 cm  LV E/e' medial:  19.0 LV IVS:        2.60 cm  LV e' lateral:   6.74 cm/s LVOT diam:     2.30 cm  LV E/e' lateral: 19.3 LV SV:         102 LV SV Index:   40 LVOT Area:     4.15 cm   RIGHT VENTRICLE             IVC RV S prime:     11.40 cm/s  IVC diam: 1.90 cm TAPSE (M-mode): 2.0 cm  LEFT ATRIUM              Index       RIGHT ATRIUM           Index LA diam:        7.00 cm  2.76 cm/m  RA Area:     18.40 cm LA Vol (A2C):   170.0 ml 67.12 ml/m RA Volume:   45.90 ml  18.12 ml/m LA Vol (A4C):   220.0 ml 86.86 ml/m LA Biplane Vol: 205.0 ml 80.94 ml/m AORTIC VALVE LVOT Vmax:   127.00 cm/s LVOT Vmean:  89.900 cm/s LVOT VTI:    0.245 m  AORTA Ao Root diam: 3.50 cm Ao Asc diam:  4.80 cm Ao Desc diam: 2.80 cm  MITRAL VALVE                TRICUSPID VALVE MV Area (PHT): 4.77 cm     TR Peak grad:   13.0 mmHg MV Decel Time: 159 msec     TR Vmax:        180.00  cm/s MV E velocity: 130.00 cm/s SHUNTS Systemic VTI:  0.24 m Systemic Diam: 2.30 cm  Belva Crome Adams Electronically signed by Belva Crome Adams Signature Date/Time: 01/23/2021/12:01:35 PM    Final            EKG  Interpretation Date/Time:  Friday March 01 2023 09:20:55 EDT Ventricular Rate:  68 PR Interval:    QRS Duration:  100 QT Interval:  390 QTC Calculation: 414 R Axis:   -1  Text Interpretation: Atrial fibrillation Late transition lateral T wave consider ischemia v repolarization When compared with ECG of 11-Sep-2008 04:11, Questionable change in QRS duration Confirmed by [name] [phone]) on 03/01/2023 9:24:44 AM     Physical Exam:    VS:  BP 132/78   Pulse 68   Ht 6\' 2"  (1.88 m)   Wt (!) 317 lb (143.8 kg)   SpO2 97%   BMI 40.70 kg/m     Wt Readings from Last 3 Encounters:  03/01/23 (!) 317 lb (143.8 kg)  01/19/22 (!) 304 lb 9.6 oz (138.2 kg)  12/28/20 (!) 300 lb 6.4 oz (136.3 kg)     GEN:  Well nourished, well developed in no acute distress HEENT: Normal NECK: No JVD; No carotid bruits LYMPHATICS: No lymphadenopathy CARDIAC: He has no murmur on physical examination RRR, no murmurs, rubs, gallops RESPIRATORY:  Clear to auscultation without rales, wheezing or rhonchi  ABDOMEN: Soft, non-tender, non-distended MUSCULOSKELETAL:  No edema; No deformity  SKIN: Warm and dry NEUROLOGIC:  Alert and oriented x 3 PSYCHIATRIC:  Normal affect    Signed, [name]  03/01/2023 10:00 AM    [facility]

## 2023-03-01 ENCOUNTER — Encounter: Payer: Self-pay | Admitting: Cardiology

## 2023-03-01 ENCOUNTER — Ambulatory Visit: Payer: Medicare HMO | Attending: Cardiology | Admitting: Cardiology

## 2023-03-01 VITALS — BP 132/78 | HR 68 | Ht 74.0 in | Wt 317.0 lb

## 2023-03-01 DIAGNOSIS — E782 Mixed hyperlipidemia: Secondary | ICD-10-CM | POA: Diagnosis not present

## 2023-03-01 DIAGNOSIS — I130 Hypertensive heart and chronic kidney disease with heart failure and stage 1 through stage 4 chronic kidney disease, or unspecified chronic kidney disease: Secondary | ICD-10-CM

## 2023-03-01 DIAGNOSIS — I7789 Other specified disorders of arteries and arterioles: Secondary | ICD-10-CM | POA: Diagnosis not present

## 2023-03-01 DIAGNOSIS — I421 Obstructive hypertrophic cardiomyopathy: Secondary | ICD-10-CM

## 2023-03-01 DIAGNOSIS — I482 Chronic atrial fibrillation, unspecified: Secondary | ICD-10-CM

## 2023-03-01 MED ORDER — ASPIRIN 81 MG PO TBEC: 81.0000 mg | DELAYED_RELEASE_TABLET | Freq: Every day | ORAL | Status: DC

## 2023-03-01 NOTE — Patient Instructions (Signed)
Medication Instructions:  Your physician has recommended you make the following change in your medication:   START: Aspirin 81 mg daily  *If you need a refill on your cardiac medications before your next appointment, please call your pharmacy*   Lab Work: None If you have labs (blood work) drawn today and your tests are completely normal, you will receive your results only by: MyChart Message (if you have MyChart) OR A paper copy in the mail If you have any lab test that is abnormal or we need to change your treatment, we will call you to review the results.   Testing/Procedures: None   Follow-Up: At [facility], you and your health needs are our priority.  As part of our continuing mission to provide you with exceptional heart care, we have created designated Provider Care Teams.  These Care Teams include your primary Cardiologist (physician) and Advanced Practice Providers (APPs -  Physician Assistants and Nurse Practitioners) who all work together to provide you with the care you need, when you need it.  We recommend signing up for the patient portal called "MyChart".  Sign up information is provided on this After Visit Summary.  MyChart is used to connect with patients for Virtual Visits (Telemedicine).  Patients are able to view lab/test results, encounter notes, upcoming appointments, etc.  Non-urgent messages can be sent to your provider as well.   To learn more about what you can do with MyChart, go to [email or web address].    Your next appointment:   6 month(s)  Provider:   Follow up in 6 months with local cardiologist  Other Instructions None

## 2023-03-05 DIAGNOSIS — L03115 Cellulitis of right lower limb: Secondary | ICD-10-CM | POA: Diagnosis not present

## 2023-03-05 DIAGNOSIS — Z6841 Body Mass Index (BMI) 40.0 and over, adult: Secondary | ICD-10-CM | POA: Diagnosis not present

## 2023-04-08 ENCOUNTER — Other Ambulatory Visit: Payer: Self-pay | Admitting: Cardiology

## 2023-05-22 DIAGNOSIS — I872 Venous insufficiency (chronic) (peripheral): Secondary | ICD-10-CM | POA: Diagnosis not present

## 2023-07-17 DIAGNOSIS — I872 Venous insufficiency (chronic) (peripheral): Secondary | ICD-10-CM | POA: Diagnosis not present

## 2023-08-01 ENCOUNTER — Other Ambulatory Visit: Payer: Self-pay | Admitting: Cardiology

## 2023-08-02 NOTE — Telephone Encounter (Signed)
Prescription sent to pharmacy.

## 2023-08-30 DIAGNOSIS — E782 Mixed hyperlipidemia: Secondary | ICD-10-CM | POA: Diagnosis not present

## 2023-08-30 DIAGNOSIS — I10 Essential (primary) hypertension: Secondary | ICD-10-CM | POA: Diagnosis not present

## 2023-08-30 DIAGNOSIS — I509 Heart failure, unspecified: Secondary | ICD-10-CM | POA: Diagnosis not present

## 2023-08-30 DIAGNOSIS — Z125 Encounter for screening for malignant neoplasm of prostate: Secondary | ICD-10-CM | POA: Diagnosis not present

## 2023-08-30 DIAGNOSIS — E039 Hypothyroidism, unspecified: Secondary | ICD-10-CM | POA: Diagnosis not present

## 2023-08-30 DIAGNOSIS — N182 Chronic kidney disease, stage 2 (mild): Secondary | ICD-10-CM | POA: Diagnosis not present

## 2023-08-30 DIAGNOSIS — I4891 Unspecified atrial fibrillation: Secondary | ICD-10-CM | POA: Diagnosis not present

## 2023-08-30 DIAGNOSIS — I422 Other hypertrophic cardiomyopathy: Secondary | ICD-10-CM | POA: Diagnosis not present

## 2023-08-30 DIAGNOSIS — R739 Hyperglycemia, unspecified: Secondary | ICD-10-CM | POA: Diagnosis not present

## 2023-08-30 DIAGNOSIS — Z6841 Body Mass Index (BMI) 40.0 and over, adult: Secondary | ICD-10-CM | POA: Diagnosis not present

## 2023-12-22 DIAGNOSIS — I6622 Occlusion and stenosis of left posterior cerebral artery: Secondary | ICD-10-CM | POA: Diagnosis not present

## 2023-12-22 DIAGNOSIS — J3489 Other specified disorders of nose and nasal sinuses: Secondary | ICD-10-CM | POA: Diagnosis not present

## 2023-12-22 DIAGNOSIS — I517 Cardiomegaly: Secondary | ICD-10-CM | POA: Diagnosis not present

## 2023-12-22 DIAGNOSIS — E039 Hypothyroidism, unspecified: Secondary | ICD-10-CM | POA: Insufficient documentation

## 2023-12-22 DIAGNOSIS — N400 Enlarged prostate without lower urinary tract symptoms: Secondary | ICD-10-CM | POA: Diagnosis not present

## 2023-12-22 DIAGNOSIS — R918 Other nonspecific abnormal finding of lung field: Secondary | ICD-10-CM | POA: Diagnosis not present

## 2023-12-22 DIAGNOSIS — I4892 Unspecified atrial flutter: Secondary | ICD-10-CM | POA: Diagnosis not present

## 2023-12-22 DIAGNOSIS — I252 Old myocardial infarction: Secondary | ICD-10-CM | POA: Diagnosis not present

## 2023-12-22 DIAGNOSIS — K219 Gastro-esophageal reflux disease without esophagitis: Secondary | ICD-10-CM | POA: Diagnosis not present

## 2023-12-22 DIAGNOSIS — I2489 Other forms of acute ischemic heart disease: Secondary | ICD-10-CM | POA: Diagnosis not present

## 2023-12-22 DIAGNOSIS — I4891 Unspecified atrial fibrillation: Secondary | ICD-10-CM | POA: Diagnosis not present

## 2023-12-22 DIAGNOSIS — J320 Chronic maxillary sinusitis: Secondary | ICD-10-CM | POA: Diagnosis not present

## 2023-12-22 DIAGNOSIS — K589 Irritable bowel syndrome without diarrhea: Secondary | ICD-10-CM

## 2023-12-22 DIAGNOSIS — I2510 Atherosclerotic heart disease of native coronary artery without angina pectoris: Secondary | ICD-10-CM | POA: Diagnosis not present

## 2023-12-22 DIAGNOSIS — K429 Umbilical hernia without obstruction or gangrene: Secondary | ICD-10-CM | POA: Diagnosis not present

## 2023-12-22 DIAGNOSIS — I371 Nonrheumatic pulmonary valve insufficiency: Secondary | ICD-10-CM | POA: Diagnosis not present

## 2023-12-22 DIAGNOSIS — I679 Cerebrovascular disease, unspecified: Secondary | ICD-10-CM | POA: Diagnosis not present

## 2023-12-22 DIAGNOSIS — K760 Fatty (change of) liver, not elsewhere classified: Secondary | ICD-10-CM | POA: Diagnosis not present

## 2023-12-22 DIAGNOSIS — G9341 Metabolic encephalopathy: Secondary | ICD-10-CM | POA: Diagnosis not present

## 2023-12-22 DIAGNOSIS — Z8679 Personal history of other diseases of the circulatory system: Secondary | ICD-10-CM | POA: Insufficient documentation

## 2023-12-22 DIAGNOSIS — I700 Atherosclerosis of aorta: Secondary | ICD-10-CM | POA: Diagnosis not present

## 2023-12-22 DIAGNOSIS — I493 Ventricular premature depolarization: Secondary | ICD-10-CM | POA: Diagnosis not present

## 2023-12-22 DIAGNOSIS — I10 Essential (primary) hypertension: Secondary | ICD-10-CM | POA: Diagnosis not present

## 2023-12-22 DIAGNOSIS — R4182 Altered mental status, unspecified: Secondary | ICD-10-CM | POA: Diagnosis not present

## 2023-12-22 DIAGNOSIS — R911 Solitary pulmonary nodule: Secondary | ICD-10-CM | POA: Diagnosis not present

## 2023-12-22 DIAGNOSIS — N281 Cyst of kidney, acquired: Secondary | ICD-10-CM | POA: Diagnosis not present

## 2023-12-22 DIAGNOSIS — A419 Sepsis, unspecified organism: Secondary | ICD-10-CM | POA: Diagnosis not present

## 2023-12-22 DIAGNOSIS — D6869 Other thrombophilia: Secondary | ICD-10-CM | POA: Diagnosis not present

## 2023-12-22 DIAGNOSIS — I480 Paroxysmal atrial fibrillation: Secondary | ICD-10-CM | POA: Diagnosis not present

## 2023-12-22 DIAGNOSIS — I6523 Occlusion and stenosis of bilateral carotid arteries: Secondary | ICD-10-CM | POA: Diagnosis not present

## 2023-12-22 DIAGNOSIS — N390 Urinary tract infection, site not specified: Secondary | ICD-10-CM | POA: Diagnosis not present

## 2023-12-22 HISTORY — DX: Personal history of other diseases of the circulatory system: Z86.79

## 2023-12-22 HISTORY — DX: Irritable bowel syndrome, unspecified: K58.9

## 2023-12-22 HISTORY — DX: Hypothyroidism, unspecified: E03.9

## 2023-12-26 DIAGNOSIS — I714 Abdominal aortic aneurysm, without rupture, unspecified: Secondary | ICD-10-CM | POA: Insufficient documentation

## 2023-12-26 DIAGNOSIS — I7121 Aneurysm of the ascending aorta, without rupture: Secondary | ICD-10-CM | POA: Insufficient documentation

## 2023-12-26 HISTORY — DX: Abdominal aortic aneurysm, without rupture, unspecified: I71.40

## 2023-12-26 HISTORY — DX: Aneurysm of the ascending aorta, without rupture: I71.21

## 2023-12-31 ENCOUNTER — Other Ambulatory Visit: Payer: Self-pay | Admitting: Cardiology

## 2024-01-06 DIAGNOSIS — I7121 Aneurysm of the ascending aorta, without rupture: Secondary | ICD-10-CM | POA: Diagnosis not present

## 2024-01-07 ENCOUNTER — Telehealth: Payer: Self-pay | Admitting: Cardiology

## 2024-01-07 DIAGNOSIS — K219 Gastro-esophageal reflux disease without esophagitis: Secondary | ICD-10-CM | POA: Insufficient documentation

## 2024-01-07 DIAGNOSIS — Z8739 Personal history of other diseases of the musculoskeletal system and connective tissue: Secondary | ICD-10-CM

## 2024-01-07 DIAGNOSIS — I714 Abdominal aortic aneurysm, without rupture, unspecified: Secondary | ICD-10-CM | POA: Diagnosis not present

## 2024-01-07 DIAGNOSIS — A419 Sepsis, unspecified organism: Secondary | ICD-10-CM | POA: Insufficient documentation

## 2024-01-07 DIAGNOSIS — N390 Urinary tract infection, site not specified: Secondary | ICD-10-CM | POA: Diagnosis not present

## 2024-01-07 DIAGNOSIS — N138 Other obstructive and reflux uropathy: Secondary | ICD-10-CM | POA: Insufficient documentation

## 2024-01-07 DIAGNOSIS — N401 Enlarged prostate with lower urinary tract symptoms: Secondary | ICD-10-CM

## 2024-01-07 HISTORY — DX: Personal history of other diseases of the musculoskeletal system and connective tissue: Z87.39

## 2024-01-07 HISTORY — DX: Other obstructive and reflux uropathy: N40.1

## 2024-01-07 HISTORY — DX: Gastro-esophageal reflux disease without esophagitis: K21.9

## 2024-01-07 HISTORY — DX: Sepsis, unspecified organism: A41.9

## 2024-01-07 NOTE — Telephone Encounter (Signed)
 Patient calling to speak with the nurse, states that he has some questions. Please advise

## 2024-01-08 NOTE — Telephone Encounter (Signed)
 Called the patient and he was asking if he had any testing done before that showed that he had an aneurysm in his aorta. After reviewing his testing I explained that it doesn't look like we have tested for that before. Patient verbalized understanding and had no further questions at this time.

## 2024-01-12 ENCOUNTER — Other Ambulatory Visit: Payer: Self-pay | Admitting: Cardiology

## 2024-02-05 DIAGNOSIS — I2510 Atherosclerotic heart disease of native coronary artery without angina pectoris: Secondary | ICD-10-CM

## 2024-02-05 DIAGNOSIS — N182 Chronic kidney disease, stage 2 (mild): Secondary | ICD-10-CM | POA: Insufficient documentation

## 2024-02-05 HISTORY — DX: Atherosclerotic heart disease of native coronary artery without angina pectoris: I25.10

## 2024-02-05 HISTORY — DX: Chronic kidney disease, stage 2 (mild): N18.2

## 2024-02-05 HISTORY — DX: Morbid (severe) obesity due to excess calories: E66.01

## 2024-02-19 NOTE — Progress Notes (Signed)
 Cardiology Office Note   Date:  02/23/2024  ID:  Maleko, Greulich 1955-09-10, MRN [id number] PCP: Keren Vicenta BRAVO, MD  [facility] Providers Cardiologist:  Redell Leiter, MD Cardiology APP:  Carlin Delon BROCKS, NP     History of Present Illness BRODIE CORRELL is a 68 y.o. male with a past medical history of permanent atrial fibrillation, CAD per CT of the chest, ascending aortic aneurysm w/o rupture, HFpEF, hypertension, HOCM, gout.  01/06/2024 CT chest CT of the chest ascending aorta 4.8 cm 12/23/2023 AAA infrarenal abdominal aortic aneurysm at 3.5 cm 12/23/2023 echo EF 55 to 60%, moderate LVH, severely dilated ascending aorta 01/05/2021 echo EF 60-65%, moderate LVH, normal PASP, LA severe dilated, aneurysm of the ascending aorta 49 mm  He established with Dr. Leiter in 2018 for the evaluation and management of HOCM.  In 2022 he had an echocardiogram revealing aneurysm of the ascending aorta at 49 mm.  Most recently evaluated by Dr. Leiter on 03/01/2023, planning to move to [facility] so follow up of his AAA was deferred until he established with new provider in [place].   He presents today for follow-up of his ascending aortic aneurysm, he has established with a cardiothoracic surgeon in [place], AAA duplex in May of this year revealed infrarenal abdominal aortic aneurysm at 3.5 cm, CT of his chest in June of this year revealed stable ascending aortic aneurysm at 4.8 cm, he has plans to follow-up with CTS in 6 months.  We discussed that during the CT of his chest coronary artery calcification was noted, he is overall asymptomatic offers no formal complaints.  His blood pressure is elevated in the office today at 130/100 however he presents a blood pressure log revealing his blood pressure overall appears to be well-controlled at home however he is also using a wrist cuff.  He denies chest pain, palpitations, dyspnea, pnd, orthopnea, n, v, dizziness, syncope, edema, weight gain, or early  satiety.    ROS: Review of Systems  All other systems reviewed and are negative.    Studies Reviewed    Risk Assessment/Calculations  CHA2DS2-VASc Score = 4   This indicates a 4.8% annual risk of stroke. The patient's score is based upon: CHF History: 1 HTN History: 1 Diabetes History: 0 Stroke History: 0 Vascular Disease History: 1 Age Score: 1 Gender Score: 0         Physical Exam VS:  BP (!) 130/100   Pulse 68   Ht 6' 2 (1.88 m)   Wt (!) 307 lb (139.3 kg)   SpO2 96%   BMI 39.42 kg/m        Wt Readings from Last 3 Encounters:  02/21/24 (!) 307 lb (139.3 kg)  03/01/23 (!) 317 lb (143.8 kg)  01/19/22 (!) 304 lb 9.6 oz (138.2 kg)    GEN: Well nourished, well developed in no acute distress NECK: No JVD; No carotid bruits CARDIAC: Irregularly irregular, no murmurs, rubs, gallops RESPIRATORY:  Clear to auscultation without rales, wheezing or rhonchi  ABDOMEN: Soft, non-tender, non-distended EXTREMITIES:  No edema; No deformity   ASSESSMENT AND PLAN Permanent atrial fibrillation/hypercoagulable state-CHA2DS2-VASc score is 4, his rate is controlled.  Continue Eliquis 5 mg twice daily--no indication for dose reduction, recent lab work checked by his PCP I do not have access to these records but patient states there was no abnormalities with his blood count, continue Coreg  25 mg twice daily.  Ascending aortic aneurysm without rupture-present on echocardiogram in 2022, most  recent CT of his chest in June of this year revealed stability in size.  We did spend quite a bit of time discussing risk reduction, including management of his hypertension.  He is following with CTS in [place] and will see them in approximately 5 months for repeat imaging.  Coronary artery calcification-this was noted on CT imaging of his chest. Stable with no anginal symptoms. No indication for ischemic evaluation.  Will check CMET, FLP, LP(a).  Dyslipidemia-will repeat CMET FLP, LP(a).    Hypertension-blood pressure is not controlled today at 130/100, he has a blood pressure log at home that appears that his blood pressure is very well-controlled however he is using a wrist device.  I advised him he needs to purchase a validated upper arm digital blood pressure cuff such as Omron, we will then have him start checking his blood pressure at least 2 hours after his antihypertensive medications in the morning.  Continue Coreg  25 mg twice daily, clonidine  0.1 mg twice daily, lisinopril 5 mg daily, spironolactone  25 mg daily.  Anticipate that his blood pressure medications might need to be adjusted some and we will plan on increasing his lisinopril.  HOCM-asymptomatic. NYHA class I.         Dispo: Purchase a new validated blood pressure cuff, blood pressure log for 2 weeks, CMET, direct LDL, LP(a) today.  He is not living locally so we will not plan for a follow-up visit at this time.  Signed, Delon JAYSON Hoover, NP

## 2024-02-21 ENCOUNTER — Ambulatory Visit: Attending: Cardiology | Admitting: Cardiology

## 2024-02-21 ENCOUNTER — Encounter: Payer: Self-pay | Admitting: Cardiology

## 2024-02-21 VITALS — BP 130/100 | HR 68 | Ht 74.0 in | Wt 307.0 lb

## 2024-02-21 DIAGNOSIS — I482 Chronic atrial fibrillation, unspecified: Secondary | ICD-10-CM | POA: Diagnosis not present

## 2024-02-21 DIAGNOSIS — I2510 Atherosclerotic heart disease of native coronary artery without angina pectoris: Secondary | ICD-10-CM

## 2024-02-21 DIAGNOSIS — I10 Essential (primary) hypertension: Secondary | ICD-10-CM | POA: Diagnosis not present

## 2024-02-21 DIAGNOSIS — I7121 Aneurysm of the ascending aorta, without rupture: Secondary | ICD-10-CM

## 2024-02-21 DIAGNOSIS — E782 Mixed hyperlipidemia: Secondary | ICD-10-CM

## 2024-02-21 DIAGNOSIS — D6859 Other primary thrombophilia: Secondary | ICD-10-CM

## 2024-02-21 NOTE — Patient Instructions (Addendum)
 Medication Instructions:   No changes   *If you need a refill on your cardiac medications before your next appointment, please call your pharmacy*  Lab Work:  Direct LDL today, LP(a), CMET  If you have labs (blood work) drawn today and your tests are completely normal, you will receive your results only by: MyChart Message (if you have MyChart) OR A paper copy in the mail If you have any lab test that is abnormal or we need to change your treatment, we will call you to review the results.  Testing/Procedures:   Follow-Up: At [facility], you and your health needs are our priority.  As part of our continuing mission to provide you with exceptional heart care, our providers are all part of one team.  This team includes your primary Cardiologist (physician) and Advanced Practice Providers or APPs (Physician Assistants and Nurse Practitioners) who all work together to provide you with the care you need, when you need it.  Your next appointment:     Provider:   Redell Leiter, MD    Other Instructions  Purchase an Omron BP cuff, check twice, then upload it to your MyChart and send to me.

## 2024-02-23 ENCOUNTER — Other Ambulatory Visit: Payer: Self-pay | Admitting: Cardiology

## 2024-02-24 LAB — COMPREHENSIVE METABOLIC PANEL WITH GFR
ALT: 18 IU/L (ref 0–44)
AST: 17 IU/L (ref 0–40)
Albumin: 4.1 g/dL (ref 3.9–4.9)
Alkaline Phosphatase: 71 IU/L (ref 44–121)
BUN/Creatinine Ratio: 11 (ref 10–24)
BUN: 12 mg/dL (ref 8–27)
Bilirubin Total: 0.6 mg/dL (ref 0.0–1.2)
CO2: 23 mmol/L (ref 20–29)
Calcium: 9.4 mg/dL (ref 8.6–10.2)
Chloride: 101 mmol/L (ref 96–106)
Creatinine, Ser: 1.06 mg/dL (ref 0.76–1.27)
Globulin, Total: 3 g/dL (ref 1.5–4.5)
Glucose: 99 mg/dL (ref 70–99)
Potassium: 4 mmol/L (ref 3.5–5.2)
Sodium: 140 mmol/L (ref 134–144)
Total Protein: 7.1 g/dL (ref 6.0–8.5)
eGFR: 75 mL/min/1.73

## 2024-02-24 LAB — LDL CHOLESTEROL, DIRECT: LDL Direct: 103 mg/dL — ABNORMAL HIGH (ref 0–99)

## 2024-02-24 LAB — LIPOPROTEIN A (LPA): Lipoprotein (a): 44.4 nmol/L

## 2024-02-25 ENCOUNTER — Other Ambulatory Visit: Payer: Self-pay

## 2024-02-25 ENCOUNTER — Ambulatory Visit: Payer: Self-pay | Admitting: Cardiology

## 2024-02-25 DIAGNOSIS — E782 Mixed hyperlipidemia: Secondary | ICD-10-CM

## 2024-02-25 MED ORDER — ROSUVASTATIN CALCIUM 20 MG PO TABS: 20.0000 mg | ORAL_TABLET | Freq: Every day | ORAL | 3 refills | Status: AC

## 2024-04-14 DIAGNOSIS — N451 Epididymitis: Secondary | ICD-10-CM | POA: Insufficient documentation

## 2024-04-21 ENCOUNTER — Telehealth: Payer: Self-pay | Admitting: Cardiology

## 2024-04-21 NOTE — Telephone Encounter (Signed)
 Please call him and let him know we received his blood pressure log, looks like blood pressure is well controlled at home, continue current medications and keep up the good work!

## 2024-04-21 NOTE — Telephone Encounter (Signed)
 Left vm to return call.

## 2024-04-22 NOTE — Telephone Encounter (Signed)
 Recommendations reviewed with pt as per [facility] note.  Pt verbalized understanding and had no additional questions.

## 2024-04-22 NOTE — Telephone Encounter (Signed)
 Patient is returning call.

## 2024-05-07 ENCOUNTER — Other Ambulatory Visit: Payer: Self-pay | Admitting: Cardiology

## 2024-05-27 ENCOUNTER — Other Ambulatory Visit: Payer: Self-pay | Admitting: Cardiology

## 2024-09-02 ENCOUNTER — Ambulatory Visit: Admitting: Cardiology
